# Patient Record
Sex: Female | Born: 1970 | Race: Black or African American | Hispanic: No | Marital: Married | Smoking: Never smoker
Health system: Southern US, Community
[De-identification: ages and names within clinical notes are randomized; demographics above are authoritative.]

## PROBLEM LIST (undated history)

## (undated) DIAGNOSIS — F329 Major depressive disorder, single episode, unspecified: Secondary | ICD-10-CM

## (undated) DIAGNOSIS — F319 Bipolar disorder, unspecified: Secondary | ICD-10-CM

## (undated) DIAGNOSIS — K5190 Ulcerative colitis, unspecified, without complications: Secondary | ICD-10-CM

## (undated) DIAGNOSIS — G894 Chronic pain syndrome: Secondary | ICD-10-CM

## (undated) DIAGNOSIS — M810 Age-related osteoporosis without current pathological fracture: Secondary | ICD-10-CM

## (undated) DIAGNOSIS — G43909 Migraine, unspecified, not intractable, without status migrainosus: Secondary | ICD-10-CM

## (undated) DIAGNOSIS — K5090 Crohn's disease, unspecified, without complications: Secondary | ICD-10-CM

## (undated) DIAGNOSIS — I10 Essential (primary) hypertension: Secondary | ICD-10-CM

## (undated) DIAGNOSIS — G4700 Insomnia, unspecified: Secondary | ICD-10-CM

## (undated) DIAGNOSIS — I639 Cerebral infarction, unspecified: Secondary | ICD-10-CM

## (undated) DIAGNOSIS — K635 Polyp of colon: Secondary | ICD-10-CM

## (undated) DIAGNOSIS — A048 Other specified bacterial intestinal infections: Secondary | ICD-10-CM

## (undated) DIAGNOSIS — E876 Hypokalemia: Secondary | ICD-10-CM

## (undated) DIAGNOSIS — C569 Malignant neoplasm of unspecified ovary: Secondary | ICD-10-CM

## (undated) DIAGNOSIS — K2210 Ulcer of esophagus without bleeding: Secondary | ICD-10-CM

## (undated) DIAGNOSIS — K589 Irritable bowel syndrome without diarrhea: Secondary | ICD-10-CM

## (undated) DIAGNOSIS — F4310 Post-traumatic stress disorder, unspecified: Secondary | ICD-10-CM

## (undated) DIAGNOSIS — F32A Depression, unspecified: Secondary | ICD-10-CM

## (undated) DIAGNOSIS — Z9889 Other specified postprocedural states: Secondary | ICD-10-CM

## (undated) DIAGNOSIS — M797 Fibromyalgia: Secondary | ICD-10-CM

## (undated) DIAGNOSIS — D563 Thalassemia minor: Secondary | ICD-10-CM

## (undated) DIAGNOSIS — M069 Rheumatoid arthritis, unspecified: Secondary | ICD-10-CM

## (undated) DIAGNOSIS — G459 Transient cerebral ischemic attack, unspecified: Secondary | ICD-10-CM

## (undated) HISTORY — DX: Ulcer of esophagus without bleeding: K22.10

## (undated) HISTORY — DX: Polyp of colon: K63.5

## (undated) HISTORY — DX: Other specified postprocedural states: Z98.890

## (undated) HISTORY — DX: Essential (primary) hypertension: I10

## (undated) HISTORY — DX: Hypokalemia: E87.6

## (undated) HISTORY — DX: Irritable bowel syndrome without diarrhea: K58.9

## (undated) HISTORY — DX: Age-related osteoporosis without current pathological fracture: M81.0

## (undated) HISTORY — DX: Migraine, unspecified, not intractable, without status migrainosus: G43.909

## (undated) HISTORY — DX: Bipolar disorder, unspecified: F31.9

## (undated) HISTORY — DX: Insomnia, unspecified: G47.00

## (undated) HISTORY — DX: Cerebral infarction, unspecified: I63.9

## (undated) HISTORY — DX: Depression, unspecified: F32.A

## (undated) HISTORY — DX: Ulcerative colitis, unspecified, without complications: K51.90

## (undated) HISTORY — DX: Other specified bacterial intestinal infections: A04.8

## (undated) HISTORY — DX: Rheumatoid arthritis, unspecified: M06.9

## (undated) HISTORY — DX: Thalassemia minor: D56.3

## (undated) HISTORY — DX: Malignant neoplasm of unspecified ovary: C56.9

## (undated) HISTORY — DX: Chronic pain syndrome: G89.4

## (undated) HISTORY — DX: Major depressive disorder, single episode, unspecified: F32.9

## (undated) HISTORY — DX: Crohn's disease, unspecified, without complications: K50.90

## (undated) HISTORY — PX: REPLACEMENT TOTAL KNEE: SUR1224

## (undated) HISTORY — DX: Transient cerebral ischemic attack, unspecified: G45.9

## (undated) HISTORY — DX: Post-traumatic stress disorder, unspecified: F43.10

## (undated) HISTORY — DX: Fibromyalgia: M79.7

---

## 1995-10-26 HISTORY — PX: APPENDECTOMY: SHX54

## 2000-10-25 HISTORY — PX: ABDOMINAL HYSTERECTOMY: SHX81

## 2007-08-15 ENCOUNTER — Encounter: Payer: Self-pay | Admitting: Endocrinology

## 2007-09-25 ENCOUNTER — Ambulatory Visit: Payer: Self-pay | Admitting: Endocrinology

## 2007-09-25 DIAGNOSIS — F411 Generalized anxiety disorder: Secondary | ICD-10-CM | POA: Insufficient documentation

## 2007-09-25 DIAGNOSIS — R51 Headache: Secondary | ICD-10-CM | POA: Insufficient documentation

## 2007-09-25 DIAGNOSIS — R519 Headache, unspecified: Secondary | ICD-10-CM | POA: Insufficient documentation

## 2007-09-25 DIAGNOSIS — R635 Abnormal weight gain: Secondary | ICD-10-CM | POA: Insufficient documentation

## 2007-09-25 DIAGNOSIS — F329 Major depressive disorder, single episode, unspecified: Secondary | ICD-10-CM | POA: Insufficient documentation

## 2011-01-19 DIAGNOSIS — Z9889 Other specified postprocedural states: Secondary | ICD-10-CM

## 2011-01-19 HISTORY — DX: Other specified postprocedural states: Z98.890

## 2011-02-05 ENCOUNTER — Encounter: Payer: Self-pay | Admitting: Urgent Care

## 2011-02-05 ENCOUNTER — Ambulatory Visit (INDEPENDENT_AMBULATORY_CARE_PROVIDER_SITE_OTHER): Payer: 59 | Admitting: Urgent Care

## 2011-02-05 DIAGNOSIS — R634 Abnormal weight loss: Secondary | ICD-10-CM | POA: Insufficient documentation

## 2011-02-05 DIAGNOSIS — K529 Noninfective gastroenteritis and colitis, unspecified: Secondary | ICD-10-CM | POA: Insufficient documentation

## 2011-02-05 DIAGNOSIS — K219 Gastro-esophageal reflux disease without esophagitis: Secondary | ICD-10-CM

## 2011-02-05 DIAGNOSIS — R1110 Vomiting, unspecified: Secondary | ICD-10-CM

## 2011-02-05 DIAGNOSIS — R197 Diarrhea, unspecified: Secondary | ICD-10-CM

## 2011-02-05 MED ORDER — DEXLANSOPRAZOLE 60 MG PO CPDR: 60.0000 mg | DELAYED_RELEASE_CAPSULE | Freq: Every day | ORAL | Status: AC

## 2011-02-05 MED ORDER — HYOSCYAMINE SULFATE 0.125 MG PO TABS: 0.1250 mg | ORAL_TABLET | Freq: Four times a day (QID) | ORAL | Status: DC | PRN

## 2011-02-05 NOTE — Assessment & Plan Note (Signed)
Unintentional wt loss in the setting of multiple GI concerns including N/V/D.  Will need EGD & colonoscopy for further evaluation.

## 2011-02-05 NOTE — Progress Notes (Signed)
Primary Care Physician:  Emelda Fear, DO Primary Gastroenterologist:  Dr. Gala Romney  Chief Complaint  Patient presents with  . Weight Loss  . Diarrhea  . Emesis    HPI:  Dana Leonard is a 40 y.o. female here as a new patient for evaluation of multiple GI concerns.  She was recently evaluated in [place] for wt loss, nausea, vomiting, & diarrhea & is concerned that something may have been missed.  She wants a second opinion & management here.   Weight loss of 40# in the past 4 months.  "Can't keep any food down."  C/o N/V post-prandially after every meal.  Hx heartburn, indigestion.  Denies dysphagia or odynophagia.  Diarrhea 10-12 stools per day.  Noticed large amt of bright red blood w/ wiping & in commode. X 2 weeks  Avoiding greasy/spicy foods/meats/fruits.  "Stays weak & dehydrated."  Was told recently K+ low, body aches.  Lost mother last yr & father had MI around the time her symptoms started, has had a lot of stress.  Hx depression & fibromyalgia.  Eats 5 meals per day.  Had colonoscopy which showed hemorrhoids in [place] per her report.  Had recent UTI & on abx for this, but after diarrhea started.  Denies any NSAIDs.  No new meds except prilosec & fiber.  Low grade fever intermittently.  Was admitted to [facility] 1 month ago w/ dehydration (will request records).    Past Medical History  Diagnosis Date  . S/P colonoscopy 01/19/2011    Dr. Earley Brooke [facility])  . Hemorrhoids   . Fibromyalgia   . Depression   . HTN (hypertension)   . Migraines   . CVA (cerebral infarction)     2011/2006 Dr.  Bland Span ([place])  . Thalassemia trait   . H. pylori infection     pt believes she never completed treatment  . IBS (irritable bowel syndrome)     pt questioning this diagnosis    Past Surgical History  Procedure Date  . Abdominal hysterectomy 2002  . Appendectomy 1997    Current Outpatient Prescriptions  Medication Sig Dispense Refill  . ALPRAZolam (XANAX)  1 MG tablet Take 1 mg by mouth at bedtime as needed.        . DULoxetine (CYMBALTA) 30 MG capsule Take 30 mg by mouth daily.        . ondansetron (ZOFRAN-ODT) 4 MG disintegrating tablet Take 4 mg by mouth every 8 (eight) hours as needed.        Marland Kitchen QUEtiapine (SEROQUEL) 300 MG tablet Take 300 mg by mouth at bedtime.        Marland Kitchen zolpidem (AMBIEN CR) 12.5 MG CR tablet Take 25 mg by mouth at bedtime as needed.        Marland Kitchen dexlansoprazole (DEXILANT) 60 MG capsule Take 1 capsule (60 mg total) by mouth daily.  30 capsule  0  . hyoscyamine (LEVSIN) 0.125 MG tablet Take 1 tablet (0.125 mg total) by mouth 4 (four) times daily as needed for cramping (diarrhea).  30 tablet  0    Allergies as of 02/05/2011 - Review Complete 02/05/2011  Allergen Reaction Noted  . Latex Rash 02/05/2011  . Codeine    . Morphine  09/25/2007  . Sumatriptan      Family History:There is no known family history of colorectal carcinoma , liver disease, or inflammatory bowel disease.   Problem Relation Age of Onset  . Sickle cell anemia Mother   . Diabetes Mother   .  Coronary artery disease Mother   . Diabetes Father   . Coronary artery disease Father   . Seizures Sister   . Coronary artery disease Sister   . Kidney failure Son     History   Social History  . Marital Status: Married    Spouse Name: N/A    Number of Children: 2  . Years of Education: N/A   Occupational History  . disabled     fibromyalgia, depression  .      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 Smoking status: Never Smoker   . Smokeless tobacco: Never Used  . Alcohol Use: No  . Drug Use: No  . Sexually Active: Yes -- Female partner(s)    Birth Control/ Protection: None     spouse   Other Topics Concern  . Not on file   Social History Narrative   Married x 4yrVolunteers as parenting advocate    Review of Systems: Gen: Denies any sweats, c/o anorexia, fatigue, weakness, malaise, weight  loss, and sleep disorder CV: Denies chest pain, angina, palpitations, syncope, orthopnea, PND, peripheral edema, and claudication. Resp: Denies dyspnea at rest, dyspnea with exercise, cough, sputum, wheezing, coughing up blood, and pleurisy. GI: Denies vomiting blood, jaundice, and fecal incontinence.   Denies dysphagia or odynophagia. GU : Denies urinary burning, blood in urine, urinary frequency, urinary hesitancy, nocturnal urination, and urinary incontinence. MS: Chronic body aches w/ hx fibromyalgia. Derm: Denies rash, itching, dry skin, hives, moles, warts, or unhealing ulcers.  Psych: Denies depression, anxiety, memory loss, suicidal ideation, hallucinations, paranoia, and confusion. Heme: Denies bruising, bleeding, and enlarged lymph nodes.  Physical Exam: BP 106/75  Pulse 75  Temp 97.4 F (36.3 C)  Ht 5' 2"  (1.575 m)  Wt 149 lb (67.586 kg)  BMI 27.25 kg/m2 General:   Alert,  Well-developed, well-nourished, pleasant and cooperative in NAD Head:  Normocephalic and atraumatic. Eyes:  Sclera clear, no icterus.   Conjunctiva pink. Ears:  Normal auditory acuity. Nose:  No deformity, discharge,  or lesions. Mouth:  No deformity or lesions, dentition normal. Neck:  Supple; no masses or thyromegaly. Lungs:  Clear throughout to auscultation.   No wheezes, crackles, or rhonchi. No acute distress. Heart:  Regular rate and rhythm; no murmurs, clicks, rubs,  or gallops. Abdomen:  Soft, nontender and nondistended. No masses, hepatosplenomegaly or hernias noted. Normal bowel sounds, without guarding, and without rebound.   Rectal:  Deferred until time of colonoscopy.   Msk:  Symmetrical without gross deformities. Normal posture. Pulses:  Normal pulses noted. Extremities:  Without clubbing or edema. Neurologic:  Alert and  oriented x4;  grossly normal neurologically. Skin:  Intact without significant lesions or rashes. Cervical Nodes:  No significant cervical adenopathy. Psych:  Alert and  cooperative. Normal mood and affect.

## 2011-02-05 NOTE — Assessment & Plan Note (Signed)
Chronic daily diarrhea x 4 months.  Will r/o c diff.  Doubt infectious cause.  Need to r/o inflammatory bowel disease, celiac disease, & microscopic colitis.

## 2011-02-05 NOTE — Assessment & Plan Note (Signed)
See GERD

## 2011-02-05 NOTE — Assessment & Plan Note (Addendum)
Will have further evaluation with EGD given chronic nausea, vomiting & weight loss to r/o PUD, complicated GERD, h pylori gastritis.  EGD & colonoscopy w/ Dr. Gala Romney in the near future.  Procedure will need to be done with deep sedation (propofol) in the OR under the direction of anesthesia services for polypharmacy.  I have discussed risks & benefits which include, but are not limited to, bleeding, infection, perforation & drug reaction.  The patient agrees with this plan & written consent will be obtained.    To ER with severe pain, nausea, vomiting. Begin Dexilant 75m daily Begin Levsin as directed for diarrhea

## 2011-02-05 NOTE — Patient Instructions (Signed)
To ER with severe pain, nausea, vomiting. Begin Dexilant 74m daily Begin Levsin as directed for diarrhea

## 2011-02-05 NOTE — Progress Notes (Signed)
Reviewed by R. Michael Rourk, MD FACP FACG 

## 2011-02-06 LAB — HEPATIC FUNCTION PANEL
Alkaline Phosphatase: 75 U/L (ref 39–117)
Indirect Bilirubin: 0.3 mg/dL (ref 0.0–0.9)
Total Bilirubin: 0.4 mg/dL (ref 0.3–1.2)
Total Protein: 6.6 g/dL (ref 6.0–8.3)

## 2011-02-06 LAB — CBC
MCV: 85.5 fL (ref 78.0–100.0)
Platelets: 323 10*3/uL (ref 150–400)
RBC: 4.01 MIL/uL (ref 3.87–5.11)
WBC: 5.4 10*3/uL (ref 4.0–10.5)

## 2011-02-06 LAB — BASIC METABOLIC PANEL
CO2: 24 mEq/L (ref 19–32)
Chloride: 106 mEq/L (ref 96–112)
Glucose, Bld: 85 mg/dL (ref 70–99)
Potassium: 3.4 mEq/L — ABNORMAL LOW (ref 3.5–5.3)
Sodium: 143 mEq/L (ref 135–145)

## 2011-02-08 NOTE — Progress Notes (Signed)
Labs faxed to Short Stay for Dr. Gala Romney and faxed to PCP

## 2011-02-09 ENCOUNTER — Encounter: Payer: Self-pay | Admitting: Internal Medicine

## 2011-02-09 NOTE — Progress Notes (Signed)
Pt scheduled for procedure 02/23/2011@11 :00am Instructions placed in the mail to pt.

## 2011-02-13 LAB — HELICOBACTER PYLORI  SPECIAL ANTIGEN

## 2011-02-15 ENCOUNTER — Inpatient Hospital Stay (HOSPITAL_COMMUNITY)
Admission: AD | Admit: 2011-02-15 | Discharge: 2011-02-19 | DRG: 641 | Disposition: A | Discharge status: Home / Self Care | Payer: 59 | Source: Ambulatory Visit | Attending: Otolaryngology | Admitting: Otolaryngology

## 2011-02-15 ENCOUNTER — Other Ambulatory Visit: Payer: Self-pay | Admitting: Internal Medicine

## 2011-02-15 ENCOUNTER — Encounter: Payer: Self-pay | Admitting: Gastroenterology

## 2011-02-15 ENCOUNTER — Telehealth: Payer: Self-pay | Admitting: Urgent Care

## 2011-02-15 ENCOUNTER — Encounter (HOSPITAL_COMMUNITY): Payer: 59

## 2011-02-15 DIAGNOSIS — Z79899 Other long term (current) drug therapy: Secondary | ICD-10-CM

## 2011-02-15 DIAGNOSIS — R55 Syncope and collapse: Secondary | ICD-10-CM | POA: Diagnosis present

## 2011-02-15 DIAGNOSIS — R109 Unspecified abdominal pain: Secondary | ICD-10-CM | POA: Diagnosis present

## 2011-02-15 DIAGNOSIS — IMO0001 Reserved for inherently not codable concepts without codable children: Secondary | ICD-10-CM | POA: Diagnosis present

## 2011-02-15 DIAGNOSIS — Z01812 Encounter for preprocedural laboratory examination: Secondary | ICD-10-CM

## 2011-02-15 DIAGNOSIS — E876 Hypokalemia: Principal | ICD-10-CM | POA: Diagnosis present

## 2011-02-15 DIAGNOSIS — R112 Nausea with vomiting, unspecified: Secondary | ICD-10-CM | POA: Diagnosis present

## 2011-02-15 DIAGNOSIS — F319 Bipolar disorder, unspecified: Secondary | ICD-10-CM | POA: Diagnosis present

## 2011-02-15 DIAGNOSIS — K2100 Gastro-esophageal reflux disease with esophagitis, without bleeding: Secondary | ICD-10-CM | POA: Diagnosis present

## 2011-02-15 DIAGNOSIS — R197 Diarrhea, unspecified: Secondary | ICD-10-CM | POA: Diagnosis present

## 2011-02-15 DIAGNOSIS — G43909 Migraine, unspecified, not intractable, without status migrainosus: Secondary | ICD-10-CM | POA: Diagnosis present

## 2011-02-15 DIAGNOSIS — E860 Dehydration: Secondary | ICD-10-CM | POA: Diagnosis present

## 2011-02-15 DIAGNOSIS — G894 Chronic pain syndrome: Secondary | ICD-10-CM | POA: Diagnosis present

## 2011-02-15 DIAGNOSIS — D649 Anemia, unspecified: Secondary | ICD-10-CM | POA: Diagnosis present

## 2011-02-15 DIAGNOSIS — T426X5A Adverse effect of other antiepileptic and sedative-hypnotic drugs, initial encounter: Secondary | ICD-10-CM | POA: Diagnosis present

## 2011-02-15 LAB — BASIC METABOLIC PANEL
BUN: 14 mg/dL (ref 6–23)
Calcium: 9.2 mg/dL (ref 8.4–10.5)
Creatinine, Ser: 0.85 mg/dL (ref 0.4–1.2)
GFR calc Af Amer: >60 mL/min (ref >60)
GFR calc non Af Amer: >60 mL/min (ref >60)

## 2011-02-15 LAB — COMPREHENSIVE METABOLIC PANEL
Alkaline Phosphatase: 80 U/L (ref 39–117)
BUN: 10 mg/dL (ref 6–23)
CO2: 31 mEq/L (ref 19–32)
Chloride: 100 mEq/L (ref 96–112)
Creatinine, Ser: 0.7 mg/dL (ref 0.4–1.2)
GFR calc non Af Amer: >60 mL/min (ref >60)
Potassium: 2.5 mEq/L — CL (ref 3.5–5.1)
Total Bilirubin: 0.4 mg/dL (ref 0.3–1.2)

## 2011-02-15 LAB — DIFFERENTIAL
Basophils Relative: 1 % (ref 0–1)
Eosinophils Absolute: 0.1 10*3/uL (ref 0.0–0.7)
Eosinophils Relative: 1 % (ref 0–5)
Lymphs Abs: 3.2 10*3/uL (ref 0.7–4.0)
Monocytes Relative: 4 % (ref 3–12)
Neutrophils Relative %: 52 % (ref 43–77)

## 2011-02-15 LAB — CBC
MCH: 29.6 pg (ref 26.0–34.0)
MCHC: 35 g/dL (ref 30.0–36.0)
MCV: 84.4 fL (ref 78.0–100.0)
Platelets: 307 10*3/uL (ref 150–400)
RDW: 12.9 % (ref 11.5–15.5)

## 2011-02-15 NOTE — Telephone Encounter (Signed)
Spoke w/ pt "cannot hold down anything" Diarrhea profuse 10+ stools/day C/o profound weakness & fatigue Pt advised to go to ER She agrees w/ plan

## 2011-02-15 NOTE — Telephone Encounter (Signed)
Pre-op noted K 2.5, TCS planned in 3 days Added MAG to labs already drawn Attempted to call pt--Not available

## 2011-02-15 NOTE — Telephone Encounter (Signed)
Discussed w/ SLF Aware pt sent to ER for hypokalemia, chronic diarrhea, TCS already sched w/ RMR 4/26

## 2011-02-16 ENCOUNTER — Inpatient Hospital Stay (HOSPITAL_COMMUNITY): Payer: 59

## 2011-02-16 ENCOUNTER — Emergency Department (HOSPITAL_COMMUNITY): Payer: 59

## 2011-02-16 DIAGNOSIS — R112 Nausea with vomiting, unspecified: Secondary | ICD-10-CM

## 2011-02-16 DIAGNOSIS — R197 Diarrhea, unspecified: Secondary | ICD-10-CM

## 2011-02-16 DIAGNOSIS — E876 Hypokalemia: Secondary | ICD-10-CM

## 2011-02-16 DIAGNOSIS — R55 Syncope and collapse: Secondary | ICD-10-CM

## 2011-02-16 LAB — CARDIAC PANEL(CRET KIN+CKTOT+MB+TROPI)
Relative Index: INVALID (ref 0.0–2.5)
Total CK: 28 U/L (ref 7–177)
Troponin I: 0.02 ng/mL (ref 0.00–0.06)

## 2011-02-16 LAB — DIFFERENTIAL
Basophils Absolute: 0 10*3/uL (ref 0.0–0.1)
Basophils Relative: 0 % (ref 0–1)
Eosinophils Absolute: 0.1 10*3/uL (ref 0.0–0.7)
Monocytes Relative: 4 % (ref 3–12)
Neutro Abs: 2.7 10*3/uL (ref 1.7–7.7)
Neutrophils Relative %: 44 % (ref 43–77)

## 2011-02-16 LAB — COMPREHENSIVE METABOLIC PANEL
ALT: 13 U/L (ref 0–35)
AST: 14 U/L (ref 0–37)
Albumin: 3.1 g/dL — ABNORMAL LOW (ref 3.5–5.2)
Calcium: 8.7 mg/dL (ref 8.4–10.5)
Chloride: 107 mEq/L (ref 96–112)
Creatinine, Ser: 0.66 mg/dL (ref 0.4–1.2)
GFR calc Af Amer: >60 mL/min (ref >60)
Sodium: 140 mEq/L (ref 135–145)

## 2011-02-16 LAB — RAPID URINE DRUG SCREEN, HOSP PERFORMED
Amphetamines: NOT DETECTED
Barbiturates: NOT DETECTED
Opiates: NOT DETECTED

## 2011-02-16 LAB — IRON AND TIBC
Saturation Ratios: 25 % (ref 20–55)
TIBC: 248 ug/dL — ABNORMAL LOW (ref 250–470)
UIBC: 185 ug/dL

## 2011-02-16 LAB — CK TOTAL AND CKMB (NOT AT ARMC)
CK, MB: 0.6 ng/mL (ref 0.3–4.0)
Relative Index: INVALID (ref 0.0–2.5)
Total CK: 32 U/L (ref 7–177)

## 2011-02-16 LAB — CBC
MCH: 29.6 pg (ref 26.0–34.0)
MCHC: 35.1 g/dL (ref 30.0–36.0)
Platelets: 262 10*3/uL (ref 150–400)
RBC: 3.35 MIL/uL — ABNORMAL LOW (ref 3.87–5.11)

## 2011-02-16 LAB — TROPONIN I: Troponin I: 0.02 ng/mL (ref 0.00–0.06)

## 2011-02-16 LAB — TSH: TSH: 1.886 u[IU]/mL (ref 0.350–4.500)

## 2011-02-16 LAB — HEMOCCULT GUIAC POC 1CARD (OFFICE): Fecal Occult Bld: POSITIVE

## 2011-02-16 LAB — FERRITIN: Ferritin: 82 ng/mL (ref 10–291)

## 2011-02-17 LAB — BASIC METABOLIC PANEL
CO2: 24 mEq/L (ref 19–32)
Calcium: 9.4 mg/dL (ref 8.4–10.5)
Chloride: 107 mEq/L (ref 96–112)
GFR calc Af Amer: >60 mL/min (ref >60)
Glucose, Bld: 97 mg/dL (ref 70–99)
Potassium: 3.4 mEq/L — ABNORMAL LOW (ref 3.5–5.1)
Sodium: 140 mEq/L (ref 135–145)

## 2011-02-17 LAB — CBC
HCT: 31.2 % — ABNORMAL LOW (ref 36.0–46.0)
Hemoglobin: 10.7 g/dL — ABNORMAL LOW (ref 12.0–15.0)
MCHC: 34.3 g/dL (ref 30.0–36.0)
RBC: 3.65 MIL/uL — ABNORMAL LOW (ref 3.87–5.11)

## 2011-02-17 LAB — OVA AND PARASITE EXAMINATION

## 2011-02-17 LAB — DIFFERENTIAL
Eosinophils Absolute: 0.2 10*3/uL (ref 0.0–0.7)
Eosinophils Relative: 2 % (ref 0–5)
Lymphs Abs: 4.4 10*3/uL — ABNORMAL HIGH (ref 0.7–4.0)
Monocytes Absolute: 0.2 10*3/uL (ref 0.1–1.0)
Monocytes Relative: 3 % (ref 3–12)

## 2011-02-18 ENCOUNTER — Other Ambulatory Visit: Payer: Self-pay | Admitting: Internal Medicine

## 2011-02-18 ENCOUNTER — Encounter: Payer: PRIVATE HEALTH INSURANCE | Admitting: Internal Medicine

## 2011-02-18 ENCOUNTER — Encounter: Payer: 59 | Admitting: Internal Medicine

## 2011-02-18 ENCOUNTER — Encounter: Payer: Self-pay | Admitting: Internal Medicine

## 2011-02-18 DIAGNOSIS — K589 Irritable bowel syndrome without diarrhea: Secondary | ICD-10-CM

## 2011-02-18 DIAGNOSIS — R112 Nausea with vomiting, unspecified: Secondary | ICD-10-CM

## 2011-02-18 DIAGNOSIS — R109 Unspecified abdominal pain: Secondary | ICD-10-CM

## 2011-02-18 DIAGNOSIS — R197 Diarrhea, unspecified: Secondary | ICD-10-CM

## 2011-02-18 DIAGNOSIS — K2100 Gastro-esophageal reflux disease with esophagitis, without bleeding: Secondary | ICD-10-CM

## 2011-02-18 DIAGNOSIS — K2210 Ulcer of esophagus without bleeding: Secondary | ICD-10-CM

## 2011-02-18 DIAGNOSIS — K5289 Other specified noninfective gastroenteritis and colitis: Secondary | ICD-10-CM

## 2011-02-18 HISTORY — DX: Ulcer of esophagus without bleeding: K22.10

## 2011-02-18 HISTORY — DX: Irritable bowel syndrome, unspecified: K58.9

## 2011-02-18 LAB — BASIC METABOLIC PANEL
BUN: 6 mg/dL (ref 6–23)
CO2: 22 mEq/L (ref 19–32)
Calcium: 9.3 mg/dL (ref 8.4–10.5)
Chloride: 106 mEq/L (ref 96–112)
Creatinine, Ser: 0.63 mg/dL (ref 0.4–1.2)
Glucose, Bld: 117 mg/dL — ABNORMAL HIGH (ref 70–99)

## 2011-02-18 LAB — CLOSTRIDIUM DIFFICILE BY PCR: Toxigenic C. Difficile by PCR: NEGATIVE

## 2011-02-19 LAB — OVA AND PARASITE EXAMINATION: Ova and parasites: NONE SEEN

## 2011-02-19 LAB — FECAL LACTOFERRIN, QUANT: Fecal Lactoferrin: NEGATIVE

## 2011-02-20 LAB — STOOL CULTURE

## 2011-02-22 LAB — STOOL CULTURE

## 2011-02-23 ENCOUNTER — Encounter: Payer: PRIVATE HEALTH INSURANCE | Admitting: Internal Medicine

## 2011-02-23 NOTE — Op Note (Signed)
Dana Leonard, Dana Leonard               ACCOUNT NO.:  [id number]  MEDICAL RECORD NO.:  [id number]           PATIENT TYPE:  I  LOCATION:  H607                          FACILITY:  [facility]  PHYSICIAN:  R. Garfield Cornea, M.D. DATE OF BIRTH:  28-Mar-1971  DATE OF PROCEDURE:  02/18/2011 DATE OF DISCHARGE:                              OPERATIVE REPORT   Esophagogastroduodenoscopy with small bowel biopsy, followup ileal colonoscopy, biopsy stool sampling.  INDICATIONS FOR PROCEDURE:  A 50-year lady with chronic abdominal pain, nausea, vomiting, weight loss, nonbloody diarrhea, ileal colonoscopy, and EGD now being done.  Risks, benefits, limitations, alternatives, imponderables have been discussed.  Because of her psychiatric issues, polypharmacy is being done under deep sedation with propofol with the help of Dr. Patsey Berthold and associates.  Risks, benefits, alternatives, limitations, imponderables have been discussed, questions answered. Please see the documentation and medical record.  PROCEDURE NOTE:  O2 saturation, blood pressure, pulse, respirations were monitored throughout the entire procedure.  Cetacaine spray for topical pharyngeal anesthesia, propofol per Dr. Patsey Berthold and associates.  INSTRUMENT:  Pentax video chip system.  FINDINGS:  EGD:  Examination of the tubular esophagus revealed 2-3 mm distal linear erosions at the GE junction.  Esophageal mucosa otherwise appeared normal.  Stomach:  Gas cavity was not empty.  There was quite a bit of retained material in the stomach, query wax matrix material versus vegetable matter versus possibly candy of some sort which precluded complete examination of the stomach.  I was able to see a good bit of stomach and assessed the antrum and pylorus.  Mucosa otherwise appeared normal.  Retroflexed to proximal stomach, esophagogastric junction demonstrated a small hiatal hernia.  Pylorus patent, easily traversed.  Examination of bulb, second and  third portion demonstrated abnormal small bowel mucosa with multiple areas of focal loss tissue with erosions and erythema of the mucosa.  This appeared to be abnormal mucosa.  THERAPEUTIC/DIAGNOSTIC MANEUVERS PERFORMED:  Biopsies of D2 and D3 were taken for histologic study, and also sample of the material in the stomach was submitted for pathologist.  The patient tolerated the procedure well and was prepared for colonoscopy.  Digital rectal exam revealed no abnormalities.  FINDINGS:  On endoscopic finding, prep was suboptimal with vegetable material and some granular stool throughout the colon which made the exam more challenging.  Colon:  Colonic mucosa was surveyed from the rectosigmoid junction through the left transverse to right colon to the appendiceal orifice, ileocecal valve/cecum.  Structures were seen photographed for the record.  Terminal ileum was intubated to 5 cm. From this level, the scope was slowly withdrawn and all previous mentioned mucosal surfaces were again seen.  The colonic mucosa appeared grossly normal as did terminal ileum mucosa.  Stool sample submitted to for Microbiology Lab, also biopsies of the ascending and descending segment taken for histologic study.  Scope was pulled down the rectum. A thorough examination of rectal mucosa including retroflex view at the anal verge demonstrated no abnormalities.  The patient tolerated this procedure well.  Cecal withdrawal time, 8 minutes.  IMPRESSION: 1. Esophagogastroduodenoscopy, tiny distal esophageal erosion     consistent  with mild erosive reflux esophagitis. 2. Retained gastric contents of uncertain significance in origin,     status post sampling, small hiatal hernia, patent pylorus, abnormal-     appearing small bowel of uncertain significance, status post     biopsy.  Colonoscopy findings, normal rectum. 3. Marginal poor prep, grossly normal colonic mucosa and terminal     ileum mucosa, status post  biopsy and stool sampling.  RECOMMENDATIONS: 1. Low residue diet. 2. Follow up on pending studies. 3. Continue proton pump inhibitor therapy as you were doing.     Bridgette Habermann, M.D.     RMR/MEDQ  D:  02/18/2011  T:  02/18/2011  Job:  [id number]  cc:   Dr. Sonia Baller, [place] Team  Electronically Signed by Jannette Spanner M.D. on 02/23/2011 07:52:17 PM

## 2011-02-25 ENCOUNTER — Encounter: Payer: Self-pay | Admitting: Internal Medicine

## 2011-03-01 ENCOUNTER — Ambulatory Visit (HOSPITAL_COMMUNITY)
Admission: RE | Admit: 2011-03-01 | Discharge: 2011-03-01 | Disposition: A | Discharge status: Home / Self Care | Payer: 59 | Source: Ambulatory Visit | Attending: Internal Medicine | Admitting: Internal Medicine

## 2011-03-01 NOTE — Discharge Summary (Signed)
Dana Leonard, Dana Leonard               ACCOUNT NO.:  [id number]  MEDICAL RECORD NO.:  [id number]           PATIENT TYPE:  I  LOCATION:  A206                          FACILITY:  [facility]  PHYSICIAN:  Kamyra Schroeck L. Conley Leonard, MDDATE OF BIRTH:  [birth year]  DATE OF ADMISSION:  02/15/2011 DATE OF DISCHARGE:  04/27/2012LH                              DISCHARGE SUMMARY   DISCHARGE DIAGNOSES: 1. Hypokalemia, resolved. 2. Syncope, suspect secondary to polypharmacy. 3. Chronic abdominal pain and diarrhea, improved. 4. Chronic migraines. 5. Fibromyalgia. 6. Bipolar disorder. 7. History of peripheral edema. 8. Polypharmacy.  DISCHARGE MEDICATIONS: 1. Xanax 1 mg p.o. q.i.d. 2. Seroquel XR 300 mg 2 tablets nightly. 3. Stop chlorthalidone. 4. Topamax 50 mg p.o. b.i.d. 5. Zanaflex 4 mg p.o. b.i.d. 6. Nucynta 100 mg p.o. q.i.d. 7. Omeprazole changed to 40 mg a day. 8. Change the Ambien to 10 mg nightly instead of 20. 9. Continue furosemide 40 mg as needed for swelling. 10.Depakote 500 mg p.o. b.i.d. 11.Cymbalta 30 mg p.o. nightly. 12.Migranal nasal spray as needed daily for migraines. 13.Stop cyclobenzaprine. 14.Continue over-the-counter vitamin B12 and thiamine as previous.  CONDITION:  Stable.  ACTIVITY:  No driving for 2 weeks.  FOLLOWUP:  With Dr. Gala Romney at which time her Pathology report will be available, currently pending.  Follow up with her primary care physician to monitor electrolytes.  CONDITION:  Stable.  DIET:  Should be low residue.  CONSULTATIONS:  Bridgette Habermann, MD  PROCEDURES:  EGD which showed retained gastric contents, small hiatal hernia, tiny distal esophageal erosion consistent with mild erosive reflux esophagitis, erosions of the small bowel of uncertain significant.  LABORATORY DATA ON ADMISSION:  Potassium was 2.5, at discharge potassium is 4.  CBC is significant for a hemoglobin of 11.2, hematocrit 32.0. Normal MCV, normal liver function tests and normal  magnesium.  Cardiac enzymes normal.  Anemia panel unremarkable.  TSH and free T4 normal. Urine drug screen negative.  Hemoccult of the stool positive.  C. diff of the stool negative.  Fecal lactoferrin normal.  Stool cultures normal.  Thus far, ova and parasite negative.  DIAGNOSTICS:  The patient refused EEG.  CT brain negative.  Abdominal films negative.  Echocardiogram normal.  EKG showed normal sinus rhythm with nonspecific changes.  HISTORY AND HOSPITAL COURSE:  Please see H and P for details.  Dana Leonard is a unassigned black female with chronic abdominal pain, diarrhea and polypharmacy.  She was getting preprocedure labs with Dr. Gala Romney when her potassium as an outpatient was noted to be about two and half.  She apparently also had a syncopal episode.  She was admitted to the hospitalist service and her potassium was repleted.  GI was consulted and performed EGD and colonoscopy, biopsies are pending.  The patient has chronic pain including migraines and fibromyalgia.  She had some chest pain while here.  She is on multiple sedating medications and Dr. Caryn Section felt that her syncopal episode may have been related to this. At the time of discharge, she has had no diarrhea.  She is tolerating 100% of her meals.  Her electrolyte disturbances are corrected and  she is stable for discharge.  She will follow up with Dr. Gala Romney for her biopsy results.  She also need to follow up with her primary care physician for the electrolyte abnormalities.  Her abdominal pain and diarrhea has been chronic.  She was on 20 mg of omeprazole prior to admission which we have increased to 40.  I have changed her Ambien dose and stopped her cyclobenzaprine as well as the chlorthalidone.  We tried to titrate down some of her psychotropic medications which apparently she had problems with.  Hopefully, this can be done with assistance of her primary care physician and pain management specialist and psychiatrist  as she is on multiple sedating medications and one of her complaints is fatigue.  Total time on the day of discharge is greater than 30 minutes.     Dana Blanks L. Conley Canal, MD     CLS/MEDQ  D:  02/19/2011  T:  02/20/2011  Job:  [id number]  Electronically Signed by Doree Barthel MD on 03/01/2011 08:06:00 AM

## 2011-03-07 NOTE — H&P (Signed)
NAMEVICI, NOVICK               ACCOUNT NO.:  [id number]  MEDICAL RECORD NO.:  [id number]           PATIENT TYPE:  E  LOCATION:  APED                          FACILITY:  [facility]  PHYSICIAN:  Rise Patience, MDDATE OF BIRTH:  05-Sep-1971  DATE OF ADMISSION:  02/15/2011 DATE OF DISCHARGE:  LH                             HISTORY & PHYSICAL   PRIMARY CARE PHYSICIAN:  Unassigned.  GASTROENTEROLOGIST:  Bridgette Habermann, MD  CHIEF COMPLAINT:  Low potassium and loss of consciousness.  HISTORY OF PRESENT ILLNESS:  This is a 40 year old female with history of bipolar disorder, fibromyalgia, migraine headaches, and previous history of CVA.  She has been experiencing nausea, vomiting, and abdominal pain with diarrhea over the last 3-4 months.  The patient has initially gone to a doctor at [facility] and had endoscopy and colonoscopy done in January and results of which are not known to the patient.  She had gone from Gastroenterology, Dr. Gala Romney who is scheduling endoscopy and colonoscopy next week for further workup on her intractable nausea, vomiting, and diarrhea with abdominal pain.  Had done an initial labs today and was found to have potassium of 2.5.  The patient was instructed to come to the hospital.  The patient over last 2 days said she got more weaker than usual and approximately lost consciousness in the hospital and also in the home.  It lasted about few minutes and did not have any loss of function of upper and lower extremities.  Did not have any difficulty speaking or any visual symptoms.  In the ER, the patient was found to have a potassium of 2.5.  At this time, the patient has already got 80 mEq of p.o. potassium along with 2 K runs.  The patient has been admitted for dehydration and hypokalemia.  The patient does take chlorthalidone and the patient also has been having recurrent diarrhea which could be contributing to her hypokalemia.  The patient states at time she  is getting off and on chest pain, retrosternal, pressure-like, moderate with exertion.  It lasts for few minutes and also associated with some shortness of breath.  These things have been happening over the last 2-3 days.  Denies any fever, chills, cough, or phlegm.  Denies any focal deficit or headache at this time.  PAST MEDICAL HISTORY: 1. Bipolar disorder, fibromyalgia, previous history of CVA 10 years     ago.  At this time, she had dysarthria, memory problems, and also     hemiplegia on the right side, it is completely resolved.  The     patient states that she still has some residual memory problems. 2. Previous history of migraine headaches.  PAST SURGICAL HISTORY:  Hysterectomy for cancer.  She does not know exactly what type of cancer and also history of appendectomy.  MEDICATIONS PRIOR TO ADMISSION: 1. Xanax 1 mg p.o. 4 times daily. 2. Seroquel 600 mg at bedtime. 3. Ambien 20 mg at bedtime. 4. Chlorthalidone 25 mg daily. 5. Topamax 50 mg twice daily in the bottles, but the patient states     she takes 150 daily.  We  need to verify this. 6. Zanaflex 4 mg twice daily. 7. Nucynta 100 mg 4 times daily. 8. Prilosec 20 mg daily.  ALLERGIES: 1. CODEINE. 2. LATEX. 3. LORTAB. 4. LYRICA. 5. METHADONE. 6. MORPHINE. 7. PERCOCET.  FAMILY HISTORY:  Positive for hypertension and diabetes.  SOCIAL HISTORY:  The patient does not smoke cigarette, drink alcohol, or use illegal drugs.  Married, lives with her husband.  REVIEW OF SYSTEMS:  As per the history of present illness.  In addition, the patient had recently taken Bactrim last week for UTI and she did take a course a month before also.  Presently, the patient does not have any urinary symptoms.  PHYSICAL EXAMINATION:  GENERAL:  The patient is examined at bedside, not in acute distress. VITAL SIGNS:  Blood pressure 104/70, pulse 90 per minute, temperature 97.6, respirations 20 per minutes, and O2 sat 97%. HEENT:   Anicteric.  No pallor.  No discharge from ears, eyes, nose, or mouth.  No facial asymmetry.  Tongue is midline. NECK:  No neck rigidity. CHEST:  Bilateral air entry present.  No rhonchi.  No crepitation. HEART:  S1 and S2 heard. ABDOMEN:  Soft.  There is mild tenderness, particularly in the lower quadrants and no guarding or rigidity. CENTRAL NERVOUS SYSTEM:  Alert, awake, and oriented to time, place, and person.  Moves upper and extremities 5/5. EXTREMITIES:  Peripheral pulses felt.  No edema.  LABORATORY DATA:  EKG shows normal sinus rhythm with heart rate around 77 beats per minute with some nonspecific ST-T changes.  I had no old EKG to compare.  CBC:  WBC 7.5, hemoglobin 11.2, hematocrit 32, and platelets 307.  Basic metabolic panel:  Sodium 226, potassium 2.5, chloride 102, carbon dioxide 32, glucose 85, BUN 14, creatinine 0.8, total bilirubin is 0.4, alk phos 80, AST 15, ALT 14, total protein 6.4, albumin 3.8, calcium 9.1, magnesium is 2.1.  ASSESSMENT: 1. Dehydration with history of chronic nausea, vomiting, abdominal     pain, and diarrhea, unclear etiology. 2. Hypokalemia. 3. Chest pain and short of breath. 4. Syncope. 5. History of fibromyalgia. 6. History of bipolar disorder. 7. History of migraine headache. 8. Previous cerebrovascular accident. 9. Anemia, normocytic normochromic.  PLAN: 1. At this time, admit the patient to telemetry. 2. For her hypokalemia, the patient has already received 80 mEq of     p.o. potassium chloride and 2 K runs.  I am going to add potassium     40 mEq along with IV fluids.  We will recheck her potassium in     another 3 hours, not at the same time.  Again, we will check     magnesium. 3. Chronic nausea, vomiting, abdominal pain, and diarrhea.  We will     check stool studies.  At this time, I also have requested Dr. Gala Romney     to see the patient.  At this time, LFTs are normal.  There is no     fever, no WBC count.  I am just ordering  a KUB at this time and     further recommendation based on Dr. Gala Romney. 4. Chest pain, syncope, and shortness of breath.  At this time, I will     check a D-dimer.  I will cycle cardiac markers as the patient's EKG     does show nonspecific ST-T changes.  We will get a CT of head     without contrast.  At this time, the patient is chest pain free.  I     am not placing the patient on any aspirin for now. 5. For her bipolar and fibromyalgia and migraine headache, we will     continue present medication.  We will need to verify her Topamax     dose. 6. Further recommendations based on tests ordered and clinical course     and consult with Dr. Seward Speck.     Rise Patience, MD     ANK/MEDQ  D:  02/16/2011  T:  02/16/2011  Job:  [id number]  cc:   R. Garfield Cornea, M.D. [address] [place] [address]  Electronically Signed by Gean Birchwood MD on 03/07/2011 08:23:37 AM

## 2011-03-08 ENCOUNTER — Ambulatory Visit: Payer: 59 | Admitting: Urgent Care

## 2011-03-08 ENCOUNTER — Encounter: Payer: Self-pay | Admitting: Endocrinology

## 2011-03-09 ENCOUNTER — Encounter: Payer: Self-pay | Admitting: Urgent Care

## 2011-03-09 ENCOUNTER — Ambulatory Visit (HOSPITAL_COMMUNITY): Payer: 59

## 2011-03-09 ENCOUNTER — Other Ambulatory Visit: Payer: Self-pay | Admitting: Urgent Care

## 2011-03-09 ENCOUNTER — Ambulatory Visit (INDEPENDENT_AMBULATORY_CARE_PROVIDER_SITE_OTHER): Payer: 59 | Admitting: Urgent Care

## 2011-03-09 VITALS — BP 103/67 | HR 97 | Temp 97.7°F | Ht 62.0 in | Wt 142.0 lb

## 2011-03-09 DIAGNOSIS — K2210 Ulcer of esophagus without bleeding: Secondary | ICD-10-CM | POA: Insufficient documentation

## 2011-03-09 DIAGNOSIS — K208 Other esophagitis without bleeding: Secondary | ICD-10-CM

## 2011-03-09 DIAGNOSIS — K529 Noninfective gastroenteritis and colitis, unspecified: Secondary | ICD-10-CM

## 2011-03-09 DIAGNOSIS — R197 Diarrhea, unspecified: Secondary | ICD-10-CM

## 2011-03-09 NOTE — Progress Notes (Signed)
Referring Provider: Emelda Fear, DO Primary Care Physician:  Emelda Fear, DO Primary Gastroenterologist:  Dr. Gala Romney  Chief Complaint  Patient presents with  . Diarrhea    incontinent of BM's    HPI:  Dana Leonard is a 40 y.o. female here for follow up for hospitalization for abd pain, nausea, vomiting & chronic diarrhea.  She also had hypokalemia.  Admitted [facility] /23, had ileocolonoscopy & EGD during admission which was normal w/ benign SB & random biopsies.  C/o diarrhea & vomiting.  Incontinence in stools. BM 8-10 stools per day w/ occ small amt bright red blood.  Husband doesn't sleep in bed w/ her due to her incontinence.  3-4 nights/ week.  Eating bland foods seem to help.  Weight loss 7# since seen here 1 mo ago.  Eating 6 mini meals per day.  Aslo noted to have anemia without evidence of iron deficiency.  Stool studies benign.  Past Medical History  Diagnosis Date  . S/P colonoscopy 01/19/2011    Dr. Earley Brooke [facility])  . Hemorrhoids   . Fibromyalgia   . Depression   . HTN (hypertension)   . Migraines   . CVA (cerebral infarction)     2011/2006 Dr.  Bland Span ([place])  . Thalassemia trait   . H. pylori infection   . IBS (irritable bowel syndrome) 02/18/11    normal ileocolonsocpy w/ Dr Gala Romney  w/ benign SB bx  . Esophagitis, erosive 02/18/11    (mild) on EGD Dr Gala Romney  . Hypokalemia     Past Surgical History  Procedure Date  . Abdominal hysterectomy 2002  . Appendectomy 1997    Current Outpatient Prescriptions  Medication Sig Dispense Refill  . ALPRAZolam (XANAX) 1 MG tablet Take 1 mg by mouth at bedtime as needed.        Marland Kitchen dexlansoprazole (DEXILANT) 60 MG capsule Take 60 mg by mouth daily.        . DULoxetine (CYMBALTA) 30 MG capsule Take 30 mg by mouth daily.        . hyoscyamine (LEVSIN, ANASPAZ) 0.125 MG tablet Take 0.125 mg by mouth 4 (four) times daily -  before meals and at bedtime.        . ondansetron (ZOFRAN-ODT) 4 MG disintegrating tablet Take 4 mg  by mouth every 8 (eight) hours as needed.        . potassium chloride SA (K-DUR,KLOR-CON) 20 MEQ tablet 20 mEq Daily.      . QUEtiapine (SEROQUEL) 300 MG tablet Take 300 mg by mouth at bedtime.        Marland Kitchen tiZANidine (ZANAFLEX) 4 MG tablet as needed.      . topiramate (TOPAMAX) 50 MG tablet 50 mg Daily.      Marland Kitchen zolpidem (AMBIEN CR) 12.5 MG CR tablet Take 25 mg by mouth at bedtime as needed.        Marland Kitchen DISCONTD: omeprazole (PRILOSEC) 20 MG capsule         Allergies as of 03/09/2011 - Review Complete 03/09/2011  Allergen Reaction Noted  . Latex Rash 02/05/2011  . Methadone  03/09/2011  . Percocet (oxycodone-acetaminophen)  03/09/2011  . Codeine    . Morphine  09/25/2007  . Sumatriptan      Review of Systems: Gen: c/o anorexia., fatigue, malaise. CV: Denies chest pain, angina, palpitations, syncope, orthopnea, PND, peripheral edema, and claudication. Resp: Denies dyspnea at rest, dyspnea with exercise, cough, sputum, wheezing, coughing up blood, and pleurisy. GI: Denies vomiting blood, jaundice. Denies dysphagia or  odynophagia. Derm: Denies rash, itching, dry skin, hives, moles, warts, or unhealing ulcers.  Psych: Denies depression, anxiety, memory loss, suicidal ideation, hallucinations, paranoia, and confusion. Heme: Denies bruising, bleeding, and enlarged lymph nodes.  Physical Exam: BP 103/67  Pulse 97  Temp 97.7 F (36.5 C)  Ht 5' 2"  (1.575 m)  Wt 142 lb (64.411 kg)  BMI 25.97 kg/m2 General:   Alert,  Well-developed, well-nourished, pleasant and cooperative in NAD Head:  Normocephalic and atraumatic. Eyes:  Sclera clear, no icterus.   Conjunctiva pink. Mouth:  No deformity or lesions, dentition normal. Neck:  Supple; no masses or thyromegaly. Heart:  Regular rate and rhythm; no murmurs, clicks, rubs,  or gallops. Abdomen:  Soft, nontender and nondistended. No masses, hepatosplenomegaly or hernias noted. Normal bowel sounds, without guarding, and without rebound.   Msk:   Symmetrical without gross deformities. Normal posture. Pulses:  Normal pulses noted. Extremities:  Without clubbing or edema. Neurologic:  Alert and  oriented x4;  grossly normal neurologically. Skin:  Intact without significant lesions or rashes. Cervical Nodes:  No significant cervical adenopathy. Psych:  Alert and cooperative. Normal mood and affect.

## 2011-03-09 NOTE — Patient Instructions (Signed)
Use levsin 4 times per day (before each meal & bedtime) Begin ALIGN once daily  Continue Dexilant 10m daily Stop omeprazole I will call CT results are back

## 2011-03-09 NOTE — Assessment & Plan Note (Signed)
Continue Dexilant 28m daily.  Doing well.    Stop omeprazole she was inadvertently taking.

## 2011-03-09 NOTE — Assessment & Plan Note (Addendum)
?   Etiology of chronic diarrhea.  Ileocolonoscopy, SB bx & EGD without cause.  IBS remains in the differential.  Continued weight loss may be contributed to multiple psychoactive medications being adjusted.  CT abd/pelvis w/ IV/oral contrast to r/o lymphoma/occult malignancy.  If normal consider testing for lactose intolerance & SBBO.   Use levsin 4 times per day (before each meal & bedtime) Begin ALIGN once daily  I will call CT results are back

## 2011-03-10 NOTE — Progress Notes (Signed)
Cc to PCP 

## 2011-03-15 ENCOUNTER — Ambulatory Visit (HOSPITAL_COMMUNITY): Payer: 59 | Attending: Urgent Care

## 2011-03-16 ENCOUNTER — Telehealth: Payer: Self-pay | Admitting: Urgent Care

## 2011-03-16 NOTE — Telephone Encounter (Signed)
Please call pt to find out why she no showed CT scan? Is she planning on rescheduling? Thanks

## 2011-03-17 NOTE — Telephone Encounter (Signed)
Called pt- LMOM, tried to call cell # and it has been disconnected

## 2011-03-18 NOTE — Telephone Encounter (Signed)
Let's send a do not neglect your health letter please, Thanks

## 2011-03-23 NOTE — Consult Note (Signed)
NAMEOMOLARA, CAROL               ACCOUNT NO.:  [id number]  MEDICAL RECORD NO.:  [id number]           PATIENT TYPE:  I  LOCATION:  T465                          FACILITY:  [facility]  PHYSICIAN:  Barney Drain, M.D.     DATE OF BIRTH:  [birth year]  DATE OF CONSULTATION: DATE OF DISCHARGE:                                CONSULTATION   PRIMARY GASTROENTEROLOGIST:  Bridgette Habermann, MD  REASON FOR CONSULTATION:  Chronic abdominal pain, nausea, vomiting, and diarrhea.  HISTORY OF PRESENT ILLNESS:  Ms. Gignac is a 40 year old black female who was initially evaluated by myself on February 05, 2011, as an outpatient in the clinic.  She had multiple GI concerns and had actually been evaluated previously in [place] and [place] where she resides.  She has history of weight loss, nausea, vomiting, and diarrhea.  She had a previous colonoscopy and EGD in [place], which showed hemorrhoids per her report.  She tells me she had, had a 40-pound weight loss in the past 4 months.  She also states she cannot keep anything down.  She was scheduled to have a colonoscopy with Dr. Gala Romney on February 18, 2011, in the OR.  She presented to preop yesterday and a metabolic panel ordered.  She was found to have a potassium of 2.5.  She was advised to go to the emergency room because she was complaining of weakness, fatigue, chronic diarrhea, which had not resolved.  While in the emergency room apparently she had a syncopal episode.  This morning, she is lying in bed with her eyes closed and is somewhat difficult to arouse and obtain history.  However, she does respond to some questions upon prompting.  She tells me she has had continuous diarrhea, too numerous to count.  She has a previous history of bright red blood in her stools wiping on the toilet tissue.  She is complaining of fatigue and weakness.  She has history of chronic hypokalemia and is on chlorthalidone as well as multiple psychiatric medications.   When she was seen in the office, she did not disclose all of her psychotropic medications to me.  She is taking Xanax 1 mg q.i.d., Seroquel 600 mg at bedtime, Ambien 20 mg at bedtime, Zanaflex 4 mg b.i.d., Nucynta 100 mg q.i.d.  Her urine drug screen was negative.  She had a one-view abdominal film, which was negative.  She has stool for C. diff culture of O and P pending.  She also has anemia panel pending.  She had a negative C. diff by PCR and stool culture outpatient.  PAST MEDICAL AND SURGICAL HISTORY:  Colonoscopy and EGD by Dr. Earley Brooke in [place], [place] which was normal per pt.  She has history of fibromyalgia and chronic pain, depression, hemorrhoids, hypertension, and migraine headaches.  She had a CVA in 2006 and 2011. She is followed by Dr. Bland Span in [facility].  She has thalassemia trait.  She has history of H. pylori infection, but does not believe that she ever completed treatment for this; however, outpatient clinic she had a negative stool antigen test.  PAST MEDICAL HISTORY:  She has history of IBS, depression.  PAST SURGICAL HISTORY:  Abdominal hysterectomy in 2002, appendectomy in 1997.  MEDICATIONS PRIOR TO ADMISSION: 1. Xanax 1 mg q.i.d. 2. Seroquel 600 mg at bedtime. 3. Ambien 20 mg at bedtime. 4. Chlorthalidone 25 mg b.i.d. 5. Topamax 50 mg b.i.d. 6. Zanaflex 4 mg b.i.d. 7. Nucynta 100 mg q.i.d. 8. Prilosec 20 mg daily.  ALLERGIES:  CODEINE, LATEX, LORTAB, LYRICA, METHADONE, MORPHINE, PERCOCET, and SUMATRIPTAN.  FAMILY HISTORY:  There is no family history of colon carcinoma, liver or chronic GI problems.  Mother did have diabetes mellitus and sickle cell anemia as well as coronary disease.  Father with diabetes mellitus, coronary artery disease.  She has a sister with seizures and a son with renal failure.  SOCIAL HISTORY:  She is married.  She has 2 children.  She is disabled from her fibromyalgia.  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  She has never been a smoker.  She denies any illicit drug use.  She denies any alcohol use.  REVIEW OF SYSTEMS:  See HPI otherwise today she would not respond to my questioning.  PHYSICAL EXAMINATION:  VITAL SIGNS:  Temperature 97.9, pulse 88, respirations 20, blood pressure 95/58, O2 sat 98% on room air. GENERAL:  She is a well-developed, well-nourished black female in no acute distress.  She is lying in bed with her eyes closed, at times she responds to questions, but other times she does not.  She does respond to tactile stimuli.  She is able to follow directions. HEENT:  Sclerae clear, nonicteric.  Conjunctivae pink.  Oropharynx pink and moist without lesions. NECK:  Supple without thyromegaly. HEART:  Regular rate and rhythm, normal S1 and S2.  No murmurs, clicks, rubs, or gallops. LUNGS:  Clear to auscultation bilaterally. ABDOMEN:  Positive bowel sounds x4.  No bruits auscultated.  Soft, nontender, nondistended without palpable mass or hepatosplenomegaly.  No rebound tenderness or guarding. EXTREMITIES:  Without clubbing or edema. SKIN:  Warm and dry without any rash or jaundice.  LABORATORY STUDIES:  White blood cell count 6.2, hemoglobin 9.9, hematocrit 28.2, platelets 262, D-dimers are 0.22.  Potassium 3.3, sodium 140, chloride 107, CO2 of 28, glucose 99, BUN 11, creatinine 0.66, total bilirubin 0.3, alkaline phosphatase 65, AST 14, ALT 13, total protein 5.5, albumin 3.1, calcium 8.7, magnesium 1.8.  Cardiac markers negative x2.  Urine drug screen was negative.  IMPRESSION:  Ms. Baugh is a 40 year old black female admitted with hypokalemia, chronic abdominal pain, nausea, vomiting, diarrhea.  She has history of chronic pain with fibromyalgia, multiple psychiatric medications, questionable syncopal episode in the ER.  Her multiple psychotropic medications will be adjusted per Dr. Caryn Section.  She also has anemia panel pending as well as  thyroid studies.  I suspect significant functional overlay as a cause of her chronic abdominal pain and diarrhea, especially given negative workup in [place].  Her hypokalemia is most likely secondary to chlorthalidone.  PLAN: 1. Dr. Caryn Section to adjust her multiple psychotropic meds. 2. Clear liquids. 3. Plan on keeping colonoscopy and EGD with Dr. Gala Romney on February 18, 2011, in the OR with propofol for deep sedation given her multiple     psychotropic meds. 4. Potassium repletion per Dr. Caryn Section. 5. Follow up anemia panel. 6. Continue supportive measures.  The above has been discussed with Dr. Caryn Section and Dr. Oneida Alar.  Thank you for allowing [place] to participate in the care of Ms. Monteleone.  Vickey Huger, N.P.   ______________________________ Barney Drain, M.D.    KJ/MEDQ  D:  02/16/2011  T:  02/16/2011  Job:  [id number]  cc:   Dr. Sonia Baller [facility]  Electronically Signed by Vickey Huger N.P. on 02/18/2011 08:31:03 AM Electronically Signed by Barney Drain M.D. on 03/23/2011 02:20:07 PM

## 2011-03-23 NOTE — Telephone Encounter (Signed)
Letter mailed to pt.  

## 2011-03-25 ENCOUNTER — Telehealth: Payer: Self-pay

## 2011-03-25 NOTE — Telephone Encounter (Signed)
Pt called- she did not go to her ct because she had a migraine, Crystal rescheduled pt for 04/02/11 at 8:45 (pt to pick up contrast) pt is aware of appointment. Pt has had a 7 pound weight loss in 1 week, she is weak, sick, tired, has diarrhea, nausea and vomiting. Pt also complaining of chest and R arm pain since Memorial day. Pt still taking Dexilant and Levsin. Advised pt that per [facility] OV note she should go to ED if she experiences abd pain, N/V. Stressed to pt how important it is that she not ignore these symptoms. Pt agreed and stated she might go to [facility] but she might wait until she hears back from either KJ or [facility]. Please advise.

## 2011-03-26 NOTE — Telephone Encounter (Signed)
Called pt to see if she went to ED. Pt stated she has not gone yet. She is waiting for her husband to get home. She stated she was too dizzy to drive herself and would go as soon as her husband got home.

## 2011-03-26 NOTE — Telephone Encounter (Signed)
Agree w/ your recommendations re:ER

## 2011-04-01 ENCOUNTER — Other Ambulatory Visit: Payer: Self-pay | Admitting: Internal Medicine

## 2011-04-01 DIAGNOSIS — R109 Unspecified abdominal pain: Secondary | ICD-10-CM

## 2011-04-01 DIAGNOSIS — K529 Noninfective gastroenteritis and colitis, unspecified: Secondary | ICD-10-CM

## 2011-04-01 DIAGNOSIS — R634 Abnormal weight loss: Secondary | ICD-10-CM

## 2011-04-02 ENCOUNTER — Inpatient Hospital Stay (HOSPITAL_COMMUNITY)
Admission: RE | Admit: 2011-04-02 | Discharge: 2011-04-02 | Payer: 59 | Source: Ambulatory Visit | Attending: Internal Medicine | Admitting: Internal Medicine

## 2011-04-02 ENCOUNTER — Ambulatory Visit (HOSPITAL_COMMUNITY): Payer: 59

## 2011-04-09 ENCOUNTER — Ambulatory Visit (HOSPITAL_COMMUNITY): Payer: 59

## 2011-06-07 ENCOUNTER — Telehealth: Payer: Self-pay

## 2011-06-07 ENCOUNTER — Ambulatory Visit (INDEPENDENT_AMBULATORY_CARE_PROVIDER_SITE_OTHER): Payer: 59 | Admitting: Urgent Care

## 2011-06-07 ENCOUNTER — Encounter: Payer: Self-pay | Admitting: Urgent Care

## 2011-06-07 DIAGNOSIS — K2210 Ulcer of esophagus without bleeding: Secondary | ICD-10-CM

## 2011-06-07 DIAGNOSIS — K529 Noninfective gastroenteritis and colitis, unspecified: Secondary | ICD-10-CM

## 2011-06-07 DIAGNOSIS — R1110 Vomiting, unspecified: Secondary | ICD-10-CM

## 2011-06-07 DIAGNOSIS — K208 Other esophagitis without bleeding: Secondary | ICD-10-CM

## 2011-06-07 DIAGNOSIS — R197 Diarrhea, unspecified: Secondary | ICD-10-CM

## 2011-06-07 DIAGNOSIS — R634 Abnormal weight loss: Secondary | ICD-10-CM

## 2011-06-07 MED ORDER — PROMETHAZINE HCL 12.5 MG PO TABS: 12.5000 mg | ORAL_TABLET | Freq: Four times a day (QID) | ORAL | Status: AC | PRN

## 2011-06-07 NOTE — Progress Notes (Signed)
Primary Care Physician:  Emelda Fear, DO Primary Gastroenterologist:  Dr. Gala Romney  Chief Complaint  Patient presents with  . Emesis  . Diarrhea    HPI:  Dana Leonard is a 40 y.o. female here for further evaluation of her multiple GI concerns including chronic N/V & diarrhea.  Now w/ significant wt loss.  C/o fatigue.  "Job is very stressful".  States she feels like she is having too many tests done & is frustrated that she is not getting any answers.  She did not proceed w/ CT scan I had ordered as last office visit.  She tells me she was not afraid of CT, but she had a migraine so she did not go.  We did send her a letter to reschedule, but did not get a response.  c/o "all the time" abd pain, nausea & vomiting.  +tearful.  C/o urgency & needing to know where bathrooms are at all times.  BM 10-12 per day.  Problems with sugar making symptoms worse.  Wt down another 28# per our records (she gives over a 68# total loss this year).  +Severe migraines.  Occasional appetite.  Symptoms worse w/ stress.  Sees Dr. Reece Levy (psych) on a regular basis since she lost mother last yr & father had MI around the time her symptoms started, has had a lot of stress.  Hx depression & fibromyalgia.  .  Chlorthalidone & lasix listed in medication reconciliation, however pt does not admit to taking these meds.  EGD & colonoscopy with random biopsies as below earlier this year w/ Dr Gala Romney did not reveal the source of her problems other than mild erosive esophagitis. Past Medical History  Diagnosis Date  . S/P colonoscopy 01/19/2011    Dr. Earley Brooke [facility])  . Hemorrhoids   . Fibromyalgia   . Depression   . HTN (hypertension)   . Migraines   . CVA (cerebral infarction)     2011/2006 Dr.  Bland Span ([place])  . Thalassemia trait   . H. pylori infection   . IBS (irritable bowel syndrome) 02/18/11    normal ileocolonsocpy w/ Dr Gala Romney  w/ benign SB bx  . Esophagitis, erosive 02/18/11    (mild) on EGD Dr Gala Romney  .  Hypokalemia     chronic   Past Surgical History  Procedure Date  . Abdominal hysterectomy 2002  . Appendectomy 1997   Current Outpatient Prescriptions  Medication Sig Dispense Refill  . ALPRAZolam (XANAX) 1 MG tablet Take 1 mg by mouth at bedtime as needed.        Marland Kitchen DEXILANT 60 MG capsule TAKE ONE CAPSULE BY MOUTH DAILY  31 capsule  5  . DULoxetine (CYMBALTA) 30 MG capsule Take 30 mg by mouth daily.        . hyoscyamine (LEVSIN, ANASPAZ) 0.125 MG tablet Take 0.125 mg by mouth 4 (four) times daily -  before meals and at bedtime.        . ondansetron (ZOFRAN-ODT) 4 MG disintegrating tablet Take 4 mg by mouth every 8 (eight) hours as needed.        . potassium chloride SA (K-DUR,KLOR-CON) 20 MEQ tablet 20 mEq Daily.      . QUEtiapine (SEROQUEL) 300 MG tablet Take 300 mg by mouth at bedtime.        Marland Kitchen tiZANidine (ZANAFLEX) 4 MG tablet as needed.      . topiramate (TOPAMAX) 50 MG tablet 50 mg Daily.      Marland Kitchen zolpidem (AMBIEN CR) 12.5  MG CR tablet Take 25 mg by mouth at bedtime as needed.        . promethazine (PHENERGAN) 12.5 MG tablet Take 1 tablet (12.5 mg total) by mouth every 6 (six) hours as needed for nausea.  30 tablet  0    Allergies as of 06/07/2011 - Review Complete 03/09/2011  Allergen Reaction Noted  . Latex Rash 02/05/2011  . Methadone  03/09/2011  . Percocet (oxycodone-acetaminophen)  03/09/2011  . Codeine    . Morphine  09/25/2007  . Sumatriptan     Family History:There is no known family history of colorectal carcinoma , liver disease, or inflammatory bowel disease.  Problem Relation Age of Onset  . Sickle cell anemia Mother   . Diabetes Mother   . Coronary artery disease Mother   . Diabetes Father   . Coronary artery disease Father   . Seizures Sister   . Coronary artery disease Sister   . Kidney failure Son    History   Social History  . Marital Status: Married    Spouse Name: N/A    Number of Children: 2  . Years of Education: N/A   Occupational History    . disabled     fibromyalgia, depression  .      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 Smoking status: Never Smoker   . Smokeless tobacco: Never Used  . Alcohol Use: No  . Drug Use: No  . Sexually Active: Yes -- Female partner(s)    Birth Control/ Protection: None     spouse   Other Topics Concern  . Not on file   Social History Narrative   Married x 35yrVolunteers as parenting advocate   Review of Systems: Gen: Denies any sweats, c/o anorexia, fatigue, weakness, malaise, weight loss, and sleep disorder CV: Denies chest pain, angina, palpitations, syncope, orthopnea, PND, peripheral edema, and claudication. Resp: Denies dyspnea at rest, dyspnea with exercise, cough, sputum, wheezing, coughing up blood, and pleurisy. GI: Denies vomiting blood, jaundice, and fecal incontinence.   Denies dysphagia or odynophagia. GU : Denies urinary burning, blood in urine, urinary frequency, urinary hesitancy, nocturnal urination, and urinary incontinence. MS: Chronic body aches w/ hx fibromyalgia. Derm: Denies rash, itching, dry skin, hives, moles, warts, or unhealing ulcers.  Heme: Denies bruising, bleeding, and enlarged lymph nodes.  Physical Exam: BP 108/71  Pulse 117  Temp(Src) 97.2 F (36.2 C) (Temporal)  Ht 5' 3"  (1.6 m)  Wt 121 lb (54.885 kg)  BMI 21.43 kg/m2 Physical Exam: BP 108/71  Pulse 117  Temp(Src) 97.2 F (36.2 C) (Temporal)  Ht 5' 3"  (1.6 m)  Wt 121 lb (54.885 kg)  BMI 21.43 kg/m2 General:   Alert,  Well-developed, well-nourished, pleasant and cooperative in NAD Head:  Normocephalic and atraumatic. Eyes:  Sclera clear, no icterus.   Conjunctiva pink. Mouth:  No deformity or lesions, dentition normal. Neck:  Supple; no masses or thyromegaly. Heart:  Regular rate and rhythm; no murmurs, clicks, rubs,  or gallops. Abdomen:  Soft, nontender and nondistended. No masses, hepatosplenomegaly or hernias noted. Normal bowel  sounds, without guarding, and without rebound.   Msk:  Symmetrical without gross deformities. Normal posture. Pulses:  Normal pulses noted. Extremities:  Without clubbing or edema. Neurologic:  Alert and  oriented x4;  grossly normal neurologically. Skin:  Intact without significant lesions or rashes. Cervical Nodes:  No significant cervical adenopathy. Psych:  Alert and cooperative.  +tearful & anxious  Head:  Normocephalic and atraumatic. Eyes:  Sclera clear, no icterus.   Conjunctiva pink. Ears:  Normal auditory acuity. Nose:  No deformity, discharge,  or lesions. Mouth:  No deformity or lesions, dentition normal. Neck:  Supple; no masses or thyromegaly. Lungs:  Clear throughout to auscultation.   No wheezes, crackles, or rhonchi. No acute distress. Heart:  Regular rate and rhythm; no murmurs, clicks, rubs,  or gallops. Abdomen:  Soft, nontender and nondistended. No masses, hepatosplenomegaly or hernias noted. Normal bowel sounds, without guarding, and without rebound.   Rectal:  Deferred until time of colonoscopy.   Msk:  Symmetrical without gross deformities. Normal posture. Pulses:  Normal pulses noted. Extremities:  Without clubbing or edema. Neurologic:  Alert and  oriented x4;  grossly normal neurologically. Skin:  Intact without significant lesions or rashes. Cervical Nodes:  No significant cervical adenopathy. Psych:  Alert and cooperative. Normal mood and affect.

## 2011-06-07 NOTE — Telephone Encounter (Signed)
Pt said the Zofran is not helping her nausea. She would like a prescription for phenergan sent to her pharmacy. [facility] in [place] on [address].

## 2011-06-07 NOTE — Telephone Encounter (Signed)
Please call pt & let her know I sent rx. Thanks

## 2011-06-07 NOTE — Patient Instructions (Addendum)
Please go get your CT scan & labs as ordered Continue dexilant 4m daily

## 2011-06-08 ENCOUNTER — Telehealth: Payer: Self-pay | Admitting: Urgent Care

## 2011-06-08 ENCOUNTER — Encounter: Payer: Self-pay | Admitting: Urgent Care

## 2011-06-08 ENCOUNTER — Other Ambulatory Visit: Payer: Self-pay | Admitting: Urgent Care

## 2011-06-08 DIAGNOSIS — R799 Abnormal finding of blood chemistry, unspecified: Secondary | ICD-10-CM

## 2011-06-08 LAB — CBC WITH DIFFERENTIAL/PLATELET
Basophils Absolute: 0 10*3/uL (ref 0.0–0.1)
Eosinophils Absolute: 0.1 10*3/uL (ref 0.0–0.7)
Eosinophils Relative: 1 % (ref 0–5)
MCH: 29.3 pg (ref 26.0–34.0)
MCV: 82.9 fL (ref 78.0–100.0)
Neutrophils Relative %: 42 % — ABNORMAL LOW (ref 43–77)
Platelets: 336 10*3/uL (ref 150–400)
RBC: 4.03 MIL/uL (ref 3.87–5.11)
RDW: 13.4 % (ref 11.5–15.5)
WBC: 6 10*3/uL (ref 4.0–10.5)

## 2011-06-08 LAB — GLIA (IGA/G) + TTG IGA
Gliadin IgA: 3.7 U/mL (ref <20)
Gliadin IgG: 1.5 U/mL (ref <20)
Tissue Transglutaminase Ab, IgA: 4.9 U/mL (ref <20)

## 2011-06-08 LAB — COMPREHENSIVE METABOLIC PANEL
Albumin: 4.4 g/dL (ref 3.5–5.2)
BUN: 12 mg/dL (ref 6–23)
CO2: 31 mEq/L (ref 19–32)
Calcium: 10.3 mg/dL (ref 8.4–10.5)
Chloride: 98 mEq/L (ref 96–112)
Glucose, Bld: 90 mg/dL (ref 70–99)
Potassium: 3 mEq/L — ABNORMAL LOW (ref 3.5–5.3)

## 2011-06-08 NOTE — Progress Notes (Signed)
Quick Note:  Please call pt- Let her know that all of her labs have not returned, but her K is low again Be sure she was fasting when the labs were drawn please Begin KCL 39mq Sig: 1 po TID today, then once daily. #40, 0RF.  Recheck MAG & K+ in 1 week. Also let her know anemia is improved, almost normal. Need iFOBT  We will call w/ the rest of her lab results & CT when they are available. ______

## 2011-06-08 NOTE — Assessment & Plan Note (Signed)
May be contributing to N/V.   Dexilant 30m daily

## 2011-06-08 NOTE — Assessment & Plan Note (Signed)
See diarrhea.

## 2011-06-08 NOTE — Assessment & Plan Note (Addendum)
Verneta Hamidi is a 40 y.o. black female w/ chronic diarrhea & significant wt loss.  Previous colonoscopy, stool studies, SBFT, small bowel bx, & EGD without significant findings as to the cause of her diarrhea.  I am quite concerned with her 60+ # weight loss, however her non-compliance is interfering with an expeditious work-up.  CT abd/pelvis w/ IV/oral contrast to r/o lymphoma & occult malignancy. Celiac panel/IgA.  If normal, consider referral to tertiary care center for 2nd opinion.  CBC, CMP, & fasting cortisol

## 2011-06-08 NOTE — Telephone Encounter (Signed)
Pt states her bottle 03/13/2011 (2 pills left) of KCl says twice per day- [place] (Dr. Emelda Fear) Pt then states she may have the wrong bottle, she's unsure I have asked her to stop by the office with ALL HER BOTTLES of MEDS to be reviewed w/ the nurse She states she will come by before her CT tomorrow which is at 1:30pm Pt advised to take KCl 59mq TID x 3 days since she is already taking BID (not daily as she told uKore[place]esterday) then resume her usual dose Thanks

## 2011-06-08 NOTE — Telephone Encounter (Signed)
Message copied by Andria Meuse on Tue Jun 08, 2011  4:19 PM ------      Message from: Claudina Lick      Created: Tue Jun 08, 2011  3:14 PM       Pt aware, ifobt and lab order  in mail to pt. Pt stated she was already taking K+ 89mq tid. I asked pt 3 times if she was sure that she was taking it tid and pt stated yes.  I called [facility] they informed me that pt was  prescribed K+ 236m one daily by Dr. MaChauncey Reading

## 2011-06-08 NOTE — Assessment & Plan Note (Signed)
?   Gastroparesis, cyclic vomiting syndrome, AIP.  Await CT findings.

## 2011-06-09 ENCOUNTER — Ambulatory Visit (HOSPITAL_COMMUNITY)
Admission: RE | Admit: 2011-06-09 | Discharge: 2011-06-09 | Disposition: A | Discharge status: Home / Self Care | Payer: 59 | Source: Ambulatory Visit | Attending: Internal Medicine | Admitting: Internal Medicine

## 2011-06-09 DIAGNOSIS — K529 Noninfective gastroenteritis and colitis, unspecified: Secondary | ICD-10-CM

## 2011-06-09 DIAGNOSIS — R634 Abnormal weight loss: Secondary | ICD-10-CM | POA: Insufficient documentation

## 2011-06-09 DIAGNOSIS — J984 Other disorders of lung: Secondary | ICD-10-CM | POA: Insufficient documentation

## 2011-06-09 DIAGNOSIS — R112 Nausea with vomiting, unspecified: Secondary | ICD-10-CM | POA: Insufficient documentation

## 2011-06-09 DIAGNOSIS — R109 Unspecified abdominal pain: Secondary | ICD-10-CM | POA: Insufficient documentation

## 2011-06-09 DIAGNOSIS — Q619 Cystic kidney disease, unspecified: Secondary | ICD-10-CM | POA: Insufficient documentation

## 2011-06-09 MED ORDER — IOHEXOL 300 MG/ML  SOLN: 100.0000 mL | Freq: Once | INTRAMUSCULAR | Status: AC | PRN

## 2011-06-09 NOTE — Progress Notes (Signed)
Quick Note:  See phone note for additional details ______

## 2011-06-09 NOTE — Progress Notes (Signed)
Cc to PCP 

## 2011-06-09 NOTE — Telephone Encounter (Signed)
Thanks Almyra Free Please update med list w/meds See previous note for complete plan Thanks

## 2011-06-09 NOTE — Progress Notes (Signed)
Please call pt & let her know nothing in her GI tract to explain her symptoms.  Discussed w/ RMR.  We would like pt to get 2nd opinion at [facility].  I will be available next week if she has any questions about this or CT.  You can tell her that  Her CT shows Mild bladder wall thickening (nonspecific) will need to address w/ Dr Paulita Fujita right kidney cyst & small nodules in lungs that do no look worrisome, but will need to be followed up w/ PCP  Based on meds reviewed in office today, she should take KCl 75mq BID x 2 more days, then once daily until OV w/ Dr MChauncey Reading #15, 0RF Needs Met 7 Monday as planned Cc:all labs, last note, procedures & bx, CT [facility]  Also needs office visit w/ Dr MChauncey Readingscheduled by our office to address: 1) chronic hypokalemia & management of diuretics 2) bladder findings on CT 3) pulmonary nodules  Please send my typed letter to Dr MChauncey Readingtoday & schedule appt Thanks

## 2011-06-09 NOTE — Telephone Encounter (Signed)
Pt came to office with rx bottles. Medications documented in OV note were from a list pt brought with her. Meds she brought in are as follows:  topiramate 127m take 11/2 bid  Dr. MTonia Ghentfilled 05/11/11 Promethazine 229m1/2 q 6 hours prn #15 filled 06/07/11 nucynta 10070m qd #120 filled 06/07/11 Zolpidem 13m63mqhs #60 filled 06/07/11 Tizanidine 4mg 42m #60 filled 06/08/11 Dr. WinikSander Radonalta 30mg 47m #30 filled 7/16 Dr. Reddy Reece Levyuel XR 300mg 235mat 6pm#60 otc B1 250mg qd6m chromium picolinate 200mcg qd21m biotin 1000mcg qd 5mB12 1000mcg qd  65mdidn't bring the following bottles: per pt report   Xanax 1mg qid- pt2mated she is taking Lasix ?mg qd - pt reports she is not taking Chlorthalidone ?mg qd - pt reports she is not taking Potassium 20 meq (per pharmacy pt is to take qd) pt reports she is out of potassium, and took last one yesterday.  Pt seemed to be upset that she could not speak with KJ today and is requesting a phone call from her at her home number to explain the next step and give her ct results.

## 2011-06-10 NOTE — Telephone Encounter (Signed)
Called, pt's home VM full. Called mobile number, got Mr. Korte, [facility] for pt to call.

## 2011-06-10 NOTE — Progress Notes (Signed)
Tried to call pt- NA 

## 2011-06-11 ENCOUNTER — Telehealth: Payer: Self-pay

## 2011-06-11 NOTE — Telephone Encounter (Signed)
Pt aware of KJ recommendations. Please fax letter and ct and labs to Dr. Chauncey Reading. Pt also needs referral to [facility].

## 2011-06-11 NOTE — Progress Notes (Signed)
Tried to call pt- LMOM 

## 2011-06-11 NOTE — Progress Notes (Signed)
Pt aware.

## 2011-06-11 NOTE — Telephone Encounter (Signed)
Records faxed to Dr. Chauncey Reading

## 2011-06-14 ENCOUNTER — Other Ambulatory Visit: Payer: Self-pay | Admitting: Urgent Care

## 2011-06-14 DIAGNOSIS — K529 Noninfective gastroenteritis and colitis, unspecified: Secondary | ICD-10-CM

## 2011-06-14 DIAGNOSIS — R634 Abnormal weight loss: Secondary | ICD-10-CM

## 2011-06-14 NOTE — Telephone Encounter (Signed)
Pt is aware of appt at [facility].

## 2011-06-14 NOTE — Telephone Encounter (Signed)
Pt aware of appt with Dr. Chauncey Reading.

## 2011-06-14 NOTE — Telephone Encounter (Signed)
Referral faxed to [facility].

## 2011-06-14 NOTE — Telephone Encounter (Signed)
Pt has an appt. At [facility] with Dr Tally Joe 07/30/2011@8 :30am

## 2011-06-14 NOTE — Telephone Encounter (Signed)
Called pt and informed the Rx for phenergan was sent in, did she get it. She said her husband was going to pick up her meds this afternoon. Then she wanted to speak to Vickey Huger, NP. I asked her could I give a message since she is seeing pt's. She said she just wanted to express her self. Still losing weight, as of yesterday down to 114 lbs. Very worried and concerned and said her family is afraid of her because her body looks so bad. She would just like to talk with Brennan Bailey. Then I asked her again, so you have not got the phenergan yet, and she said oh, Yes. And I have been taking it and it has not been helping at all.

## 2011-06-14 NOTE — Telephone Encounter (Signed)
I was told by receptionist at [facility] that they will call back with an appt. Later today. They can give this appt to me or Crystal.

## 2011-06-14 NOTE — Telephone Encounter (Signed)
Returned pt's call & LMOM

## 2011-06-14 NOTE — Telephone Encounter (Signed)
Pt is scheduled to see Dr Chauncey Reading 08/21@ 1:30, left mess with instructions on pts VM

## 2011-06-14 NOTE — Telephone Encounter (Signed)
Pt has an appt 07/30/11@8 :30am

## 2011-08-03 ENCOUNTER — Ambulatory Visit: Payer: 59 | Admitting: Gastroenterology

## 2011-08-04 ENCOUNTER — Telehealth: Payer: Self-pay | Admitting: Gastroenterology

## 2011-08-04 ENCOUNTER — Telehealth: Payer: Self-pay

## 2011-08-04 NOTE — Telephone Encounter (Signed)
Pt called- she said she didn't come to appt yesterday because she didn't feel good. She went last night and had blood work drawn for her pain management doctor. She said her potassium was 2.1 and they advised her to be admitted and get some IV potassium. Pt called [place] wanting to know if we could admit her. Advised pt she definitely needed potassium but she needed to contact whoever ordered the blood work. Warned her of dangers of low potassium and that she needed to seek medical attention either from MD who ordered labs or at ED. Pt said her neurologist was going to admit her tomorrow for her migraines. Pt has appt here tomorrow with AS. She is not sure if she will make it or not.

## 2011-08-04 NOTE — Telephone Encounter (Signed)
Agreed. We did not order the blood work. She needs to go to the ED. This can cause severe problems.

## 2011-08-04 NOTE — Telephone Encounter (Signed)
PT CALLED ASKING FOR [place] TO REFER HER TO THE [facility] - SHE SAID SHE HAD MEET ONE OF THE DR FROM THERE THROUGH A MUTUAL FRIEND. I ASKED HER IF SHE HAD KEPT HER APPT AT [facility] THAT HAD BEEN SCHEDULED FOR 07/29/11 AND SHE STATED  "NO, SHE JUST DIDN'T FEEL LIKE GOING."  PT ASKED TO BE SEEN HERE FOR FURTHER FOLLOW UP OF HER POOR PO INTAKE- APPT SCHEDULED

## 2011-08-05 ENCOUNTER — Telehealth: Payer: Self-pay | Admitting: Gastroenterology

## 2011-08-05 ENCOUNTER — Ambulatory Visit: Payer: 59 | Admitting: Gastroenterology

## 2011-08-09 ENCOUNTER — Telehealth: Payer: Self-pay

## 2011-08-09 NOTE — Telephone Encounter (Signed)
Pt called- she is requesting again to be referred to [facility] for her GI issues. She said she was admitted to [facility] last week d/t her K+ level and that is why she didn't come to her appt.

## 2011-08-11 NOTE — Telephone Encounter (Signed)
NO SHOW

## 2011-08-12 ENCOUNTER — Telehealth: Payer: Self-pay | Admitting: Gastroenterology

## 2011-08-12 NOTE — Telephone Encounter (Signed)
Records faxed to [facility] per pts request

## 2011-08-12 NOTE — Telephone Encounter (Signed)
Notes faxed.

## 2011-08-18 ENCOUNTER — Telehealth: Payer: Self-pay | Admitting: Gastroenterology

## 2011-08-18 NOTE — Telephone Encounter (Signed)
Received fax from [facility] is scheduled to see Dr Prescott Parma in their GI clinic on 08/25/11

## 2011-08-18 NOTE — Telephone Encounter (Signed)
Glad to hear.

## 2011-08-18 NOTE — Telephone Encounter (Signed)
Received fa

## 2012-11-23 IMAGING — CT CT ABD-PELV W/ CM
2 of 3 series · 16 of 46 positions shown, 18 images · IV contrast (Omnipaque 300)
Comparison: None

CLINICAL DATA: Mild pain, chronic diarrhea, weight loss, nausea,
vomiting, prior appendectomy

CT ABDOMEN AND PELVIS WITH CONTRAST
TECHNIQUE: Multidetector CT imaging of the abdomen and pelvis was
performed following the standard protocol during bolus
administration of intravenous contrast. Sagittal and coronal MPR
images reconstructed from axial data set.
Contrast: Dilute oral contrast. 100 ml Omnipaque 300 IV.  Of

[Series 2: abd_pel_with 5.0 b40f · axial · 0.65mm/px · z∈[-410,-36]mm · 13 of 87 slices shown, 15 images]
[im 6/87  soft-tissue]
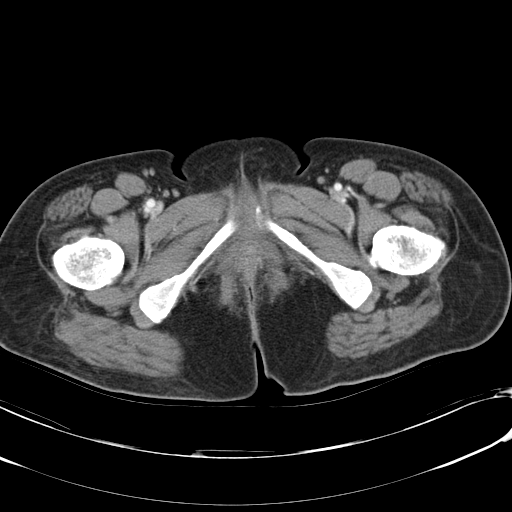
[im 6/87  bone]
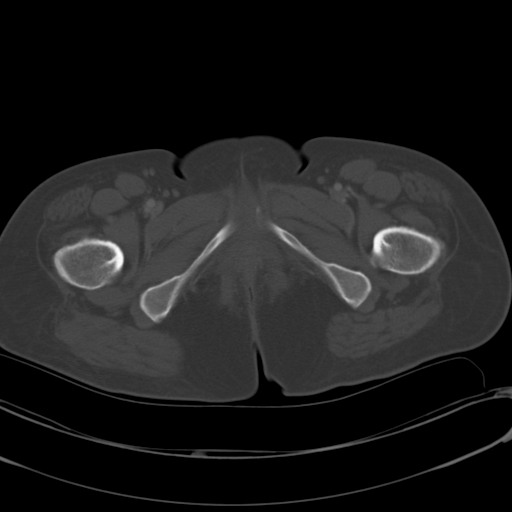
[im 12/87  soft-tissue]
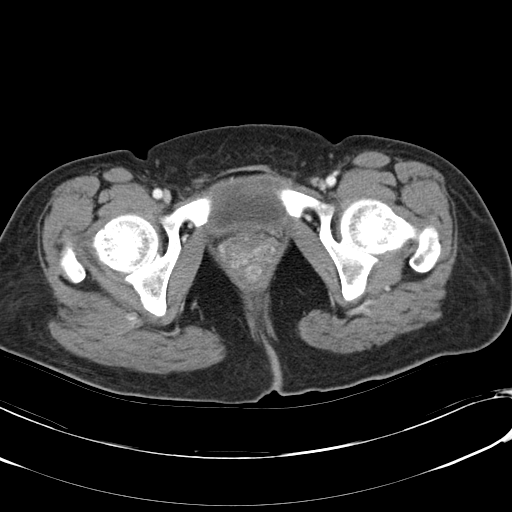
[im 17/87  soft-tissue]
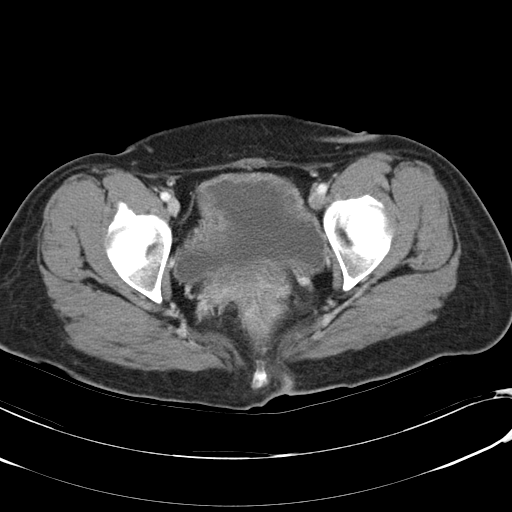
[im 25/87  soft-tissue]
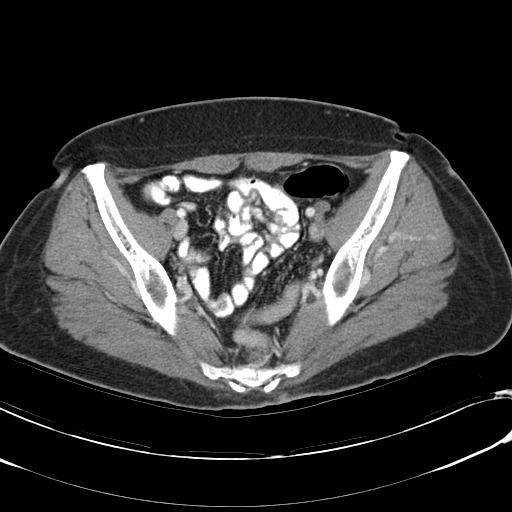
[im 31/87  soft-tissue]
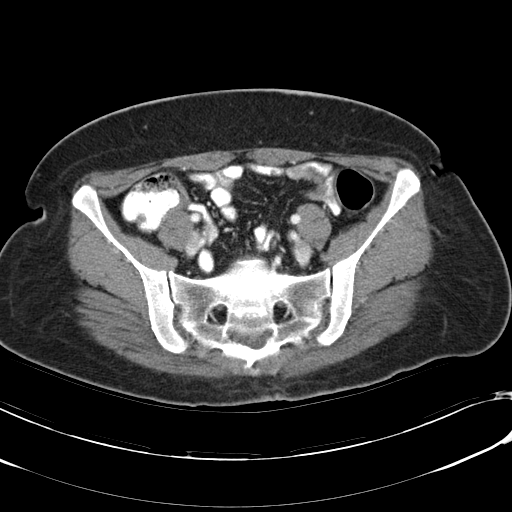
[im 37/87  soft-tissue]
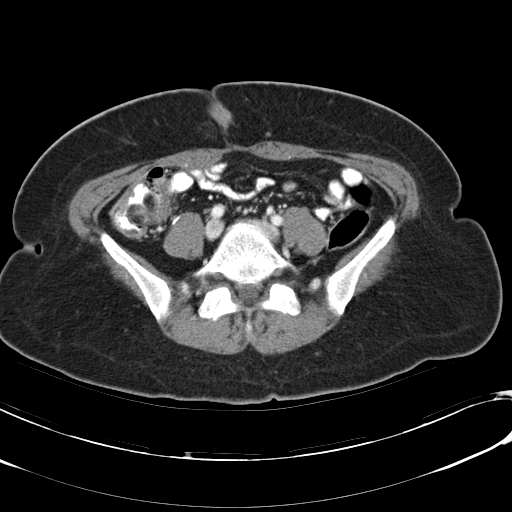
[im 45/87  soft-tissue]
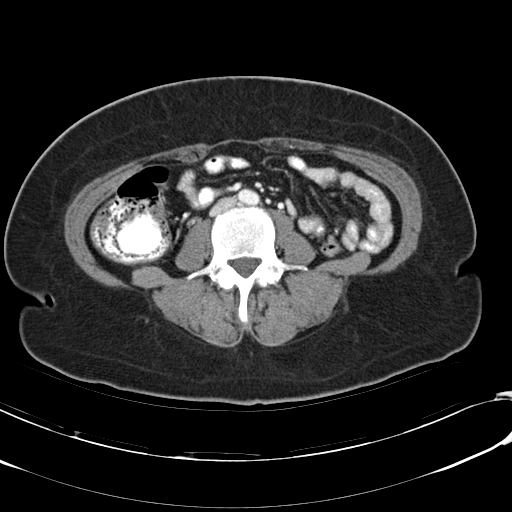
[im 50/87  soft-tissue]
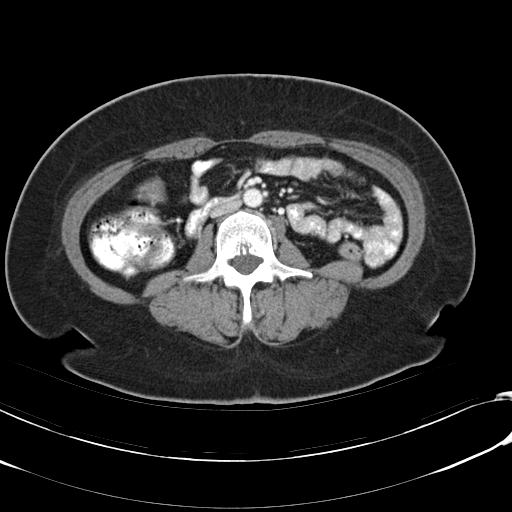
[im 56/87  soft-tissue]
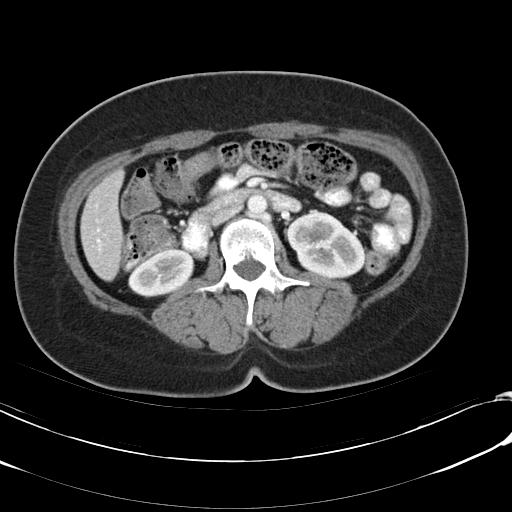
[im 56/87  bone]
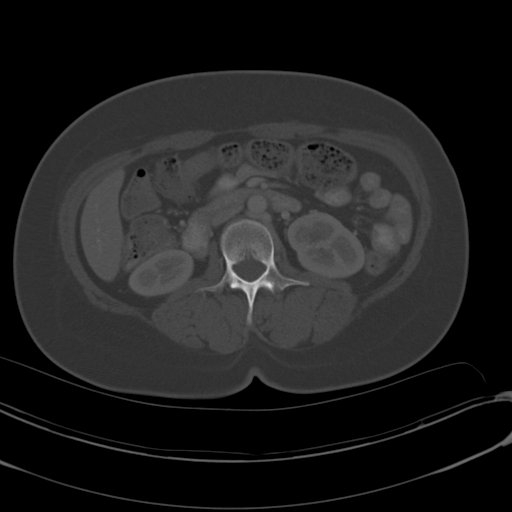
[im 62/87  soft-tissue]
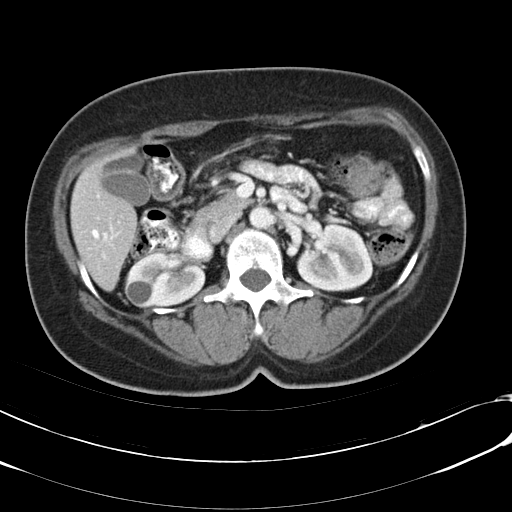
[im 70/87  soft-tissue]
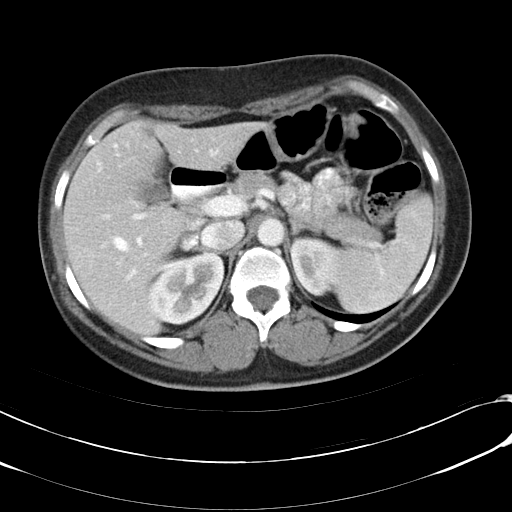
[im 75/87  soft-tissue]
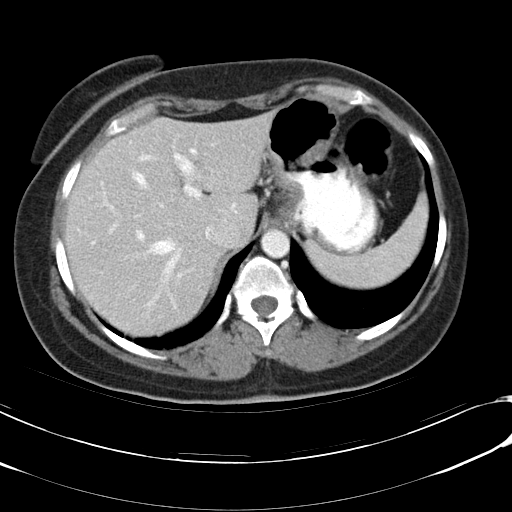
[im 81/87  soft-tissue]
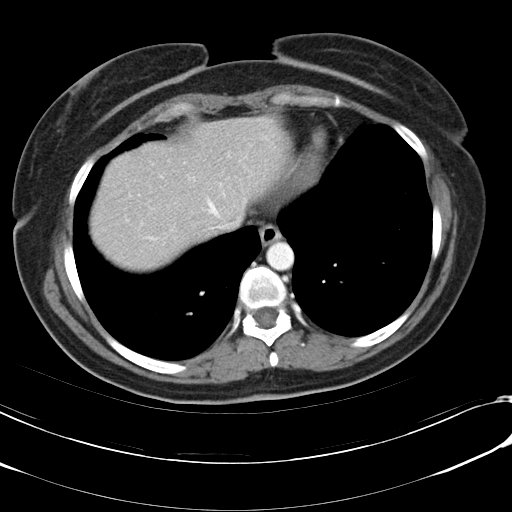

[Series 4: abd_pel_with 3.0 spo cor · coronal · 0.62mm/px · 3 of 66 slices shown]
[im 22/66  soft-tissue]
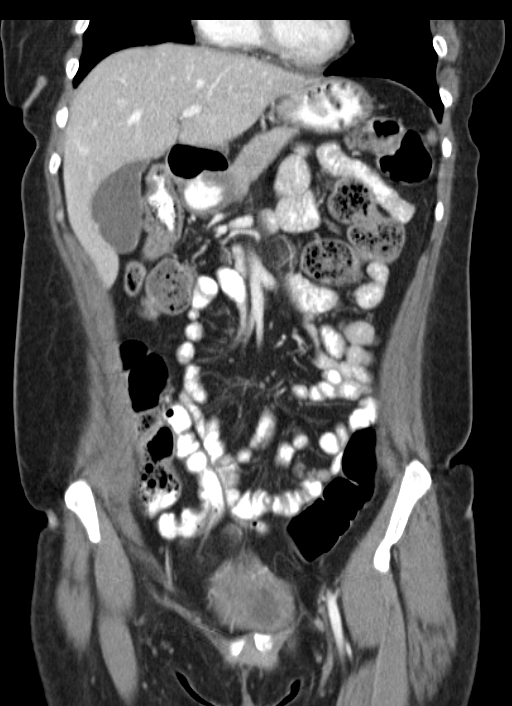
[im 29/66  soft-tissue]
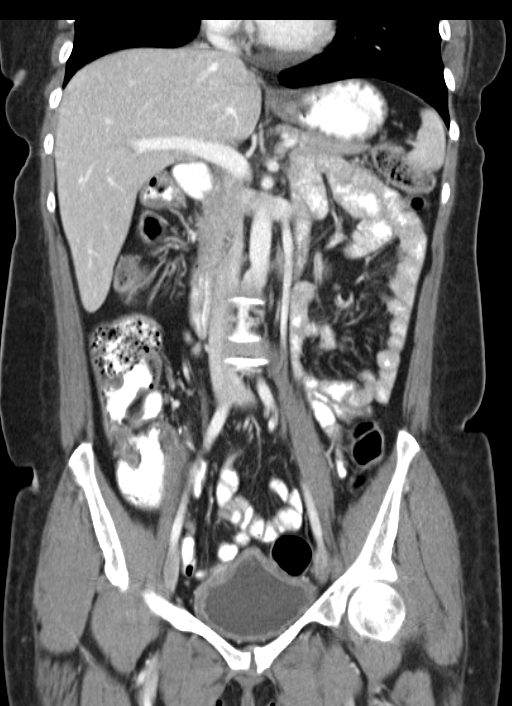
[im 37/66  soft-tissue]
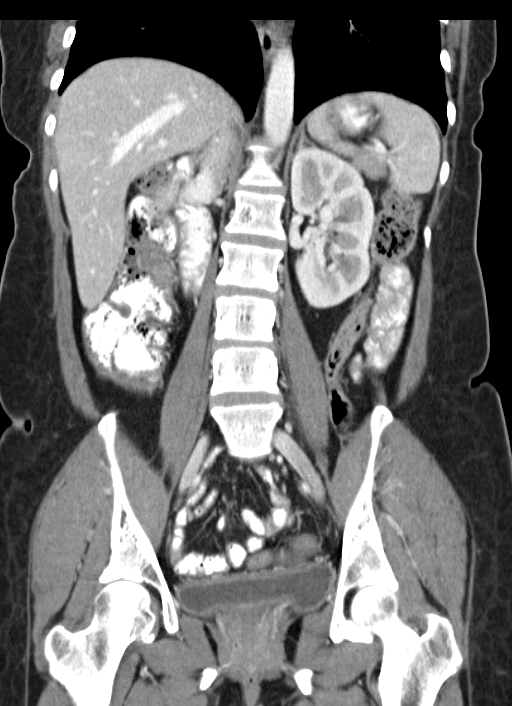

[16 of 46 positions shown; findings below may reference images not displayed]

FINDINGS: 3 mm diameter right lower lobe nodule image 5.
3 mm diameter left lower lobe nodule image 5.
Right renal cyst 1.6 x 1.6 cm image 26.
Minimal focal fatty infiltration of liver adjacent to falciform
fissure.
Remainder of liver, spleen, pancreas, kidneys, and adrenal glands
normal.
Appendix and uterus surgically absent with nonvisualization of
ovaries.
Rectosigmoid colon under distended and unopacified, unable to
exclude wall thickening.
Mild bladder wall thickening, nonspecific.
Remaining bowel loops normal appearance.
No mass, adenopathy, free fluid or inflammatory process.
Question small periumbilical hernia containing fat.
No acute osseous findings.
IMPRESSION: Mild bladder wall thickening, nonspecific, can be seen with chronic
outlet obstruction, infectious inflammatory processes, unlikely
tumor due to the diffuse uniform nature of thickening.
Small right renal cyst.
Bilateral lower lobe pulmonary nodules, nonspecific, uncertain
etiology; these are likely benign but in the setting of unexplained
weight loss, other intrathoracic pathology/malignancy could also be
considered.
Consider follow-up CT imaging of the chest if there is clinical
concern for additional intrathoracic abnormalities.

## 2012-12-26 ENCOUNTER — Telehealth: Payer: Self-pay | Admitting: Urgent Care

## 2012-12-26 NOTE — Telephone Encounter (Signed)
Letter from [facility]. Pt no showed on 12/20/12 for FU with GI clinic

## 2014-05-31 ENCOUNTER — Encounter (HOSPITAL_COMMUNITY): Payer: Self-pay | Admitting: Emergency Medicine

## 2014-05-31 ENCOUNTER — Emergency Department (HOSPITAL_COMMUNITY)
Admission: EM | Admit: 2014-05-31 | Discharge: 2014-06-01 | Disposition: A | Discharge status: Other Acute Inpatient Hospital | Payer: 59 | Attending: Emergency Medicine | Admitting: Emergency Medicine

## 2014-05-31 DIAGNOSIS — F329 Major depressive disorder, single episode, unspecified: Secondary | ICD-10-CM | POA: Diagnosis not present

## 2014-05-31 DIAGNOSIS — Z791 Long term (current) use of non-steroidal anti-inflammatories (NSAID): Secondary | ICD-10-CM | POA: Insufficient documentation

## 2014-05-31 DIAGNOSIS — Z79899 Other long term (current) drug therapy: Secondary | ICD-10-CM | POA: Insufficient documentation

## 2014-05-31 DIAGNOSIS — I10 Essential (primary) hypertension: Secondary | ICD-10-CM | POA: Insufficient documentation

## 2014-05-31 DIAGNOSIS — Z8619 Personal history of other infectious and parasitic diseases: Secondary | ICD-10-CM | POA: Diagnosis not present

## 2014-05-31 DIAGNOSIS — Z9889 Other specified postprocedural states: Secondary | ICD-10-CM | POA: Insufficient documentation

## 2014-05-31 DIAGNOSIS — Z91040 Latex allergy status: Secondary | ICD-10-CM | POA: Diagnosis not present

## 2014-05-31 DIAGNOSIS — F3289 Other specified depressive episodes: Secondary | ICD-10-CM | POA: Insufficient documentation

## 2014-05-31 DIAGNOSIS — T424X2A Poisoning by benzodiazepines, intentional self-harm, initial encounter: Secondary | ICD-10-CM

## 2014-05-31 DIAGNOSIS — K589 Irritable bowel syndrome without diarrhea: Secondary | ICD-10-CM | POA: Insufficient documentation

## 2014-05-31 DIAGNOSIS — G43909 Migraine, unspecified, not intractable, without status migrainosus: Secondary | ICD-10-CM | POA: Insufficient documentation

## 2014-05-31 DIAGNOSIS — Z862 Personal history of diseases of the blood and blood-forming organs and certain disorders involving the immune mechanism: Secondary | ICD-10-CM | POA: Diagnosis not present

## 2014-05-31 DIAGNOSIS — T438X2A Poisoning by other psychotropic drugs, intentional self-harm, initial encounter: Secondary | ICD-10-CM | POA: Insufficient documentation

## 2014-05-31 DIAGNOSIS — T424X4A Poisoning by benzodiazepines, undetermined, initial encounter: Secondary | ICD-10-CM | POA: Diagnosis present

## 2014-05-31 DIAGNOSIS — R45851 Suicidal ideations: Secondary | ICD-10-CM | POA: Diagnosis not present

## 2014-05-31 DIAGNOSIS — Z8639 Personal history of other endocrine, nutritional and metabolic disease: Secondary | ICD-10-CM | POA: Diagnosis not present

## 2014-05-31 DIAGNOSIS — T43502A Poisoning by unspecified antipsychotics and neuroleptics, intentional self-harm, initial encounter: Secondary | ICD-10-CM | POA: Diagnosis not present

## 2014-05-31 LAB — ACETAMINOPHEN LEVEL: Acetaminophen (Tylenol), Serum: <15 ug/mL (ref 10–30)

## 2014-05-31 LAB — CBC
HEMATOCRIT: 30.8 % — AB (ref 36.0–46.0)
HEMOGLOBIN: 10.5 g/dL — AB (ref 12.0–15.0)
MCH: 29.2 pg (ref 26.0–34.0)
MCHC: 34.1 g/dL (ref 30.0–36.0)
MCV: 85.6 fL (ref 78.0–100.0)
Platelets: 185 10*3/uL (ref 150–400)
RBC: 3.6 MIL/uL — AB (ref 3.87–5.11)
RDW: 14.7 % (ref 11.5–15.5)
WBC: 4.6 10*3/uL (ref 4.0–10.5)

## 2014-05-31 LAB — RAPID URINE DRUG SCREEN, HOSP PERFORMED
Amphetamines: NOT DETECTED
Barbiturates: NOT DETECTED
Benzodiazepines: POSITIVE — AB
Cocaine: NOT DETECTED
Opiates: NOT DETECTED
Tetrahydrocannabinol: NOT DETECTED

## 2014-05-31 LAB — ETHANOL

## 2014-05-31 LAB — COMPREHENSIVE METABOLIC PANEL
ALT: 15 U/L (ref 0–35)
AST: 15 U/L (ref 0–37)
Albumin: 3.4 g/dL — ABNORMAL LOW (ref 3.5–5.2)
Alkaline Phosphatase: 127 U/L — ABNORMAL HIGH (ref 39–117)
Anion gap: 15 (ref 5–15)
BUN: 15 mg/dL (ref 6–23)
CALCIUM: 9.1 mg/dL (ref 8.4–10.5)
CO2: 25 meq/L (ref 19–32)
Chloride: 104 mEq/L (ref 96–112)
Creatinine, Ser: 1.23 mg/dL — ABNORMAL HIGH (ref 0.50–1.10)
GFR, EST AFRICAN AMERICAN: 62 mL/min — AB (ref >90)
GFR, EST NON AFRICAN AMERICAN: 53 mL/min — AB (ref >90)
GLUCOSE: 107 mg/dL — AB (ref 70–99)
Potassium: 3.2 mEq/L — ABNORMAL LOW (ref 3.7–5.3)
SODIUM: 144 meq/L (ref 137–147)
Total Bilirubin: <0.2 mg/dL — ABNORMAL LOW (ref 0.3–1.2)
Total Protein: 6.9 g/dL (ref 6.0–8.3)

## 2014-05-31 LAB — URINALYSIS, ROUTINE W REFLEX MICROSCOPIC
Bilirubin Urine: NEGATIVE
Glucose, UA: NEGATIVE mg/dL
Hgb urine dipstick: NEGATIVE
Ketones, ur: NEGATIVE mg/dL
LEUKOCYTES UA: NEGATIVE
NITRITE: NEGATIVE
PH: 5.5 (ref 5.0–8.0)
PROTEIN: NEGATIVE mg/dL
Specific Gravity, Urine: 1.013 (ref 1.005–1.030)
Urobilinogen, UA: 0.2 mg/dL (ref 0.0–1.0)

## 2014-05-31 LAB — SALICYLATE LEVEL: Salicylate Lvl: <2 mg/dL — ABNORMAL LOW (ref 2.8–20.0)

## 2014-05-31 LAB — VALPROIC ACID LEVEL: Valproic Acid Lvl: 23.9 ug/mL — ABNORMAL LOW (ref 50.0–100.0)

## 2014-05-31 MED ORDER — POTASSIUM CHLORIDE CRYS ER 20 MEQ PO TBCR
20.0000 meq | EXTENDED_RELEASE_TABLET | Freq: Every day | ORAL | Status: DC
  Administered 2014-06-01: 20 meq via ORAL
  Filled 2014-05-31: qty 1

## 2014-05-31 MED ORDER — CHARCOAL ACTIVATED PO LIQD
50.0000 g | Freq: Once | ORAL | Status: AC
  Administered 2014-05-31: 50 g via ORAL
  Filled 2014-05-31: qty 240

## 2014-05-31 MED ORDER — TIZANIDINE HCL 4 MG PO TABS
4.0000 mg | ORAL_TABLET | Freq: Two times a day (BID) | ORAL | Status: DC
  Administered 2014-05-31 – 2014-06-01 (×2): 4 mg via ORAL
  Filled 2014-05-31 (×3): qty 1

## 2014-05-31 MED ORDER — DIHYDROERGOTAMINE MESYLATE 4 MG/ML NA SOLN
1.0000 | NASAL | Status: DC | PRN
  Filled 2014-05-31: qty 0.13

## 2014-05-31 MED ORDER — ALPRAZOLAM 0.5 MG PO TABS
1.0000 mg | ORAL_TABLET | Freq: Every evening | ORAL | Status: DC | PRN
Start: 2014-05-31 — End: 2014-05-31

## 2014-05-31 MED ORDER — SODIUM CHLORIDE 0.9 % IV BOLUS (SEPSIS)
1000.0000 mL | INTRAVENOUS | Status: AC
  Administered 2014-05-31: 1000 mL via INTRAVENOUS

## 2014-05-31 MED ORDER — ALPRAZOLAM 0.5 MG PO TABS
1.0000 mg | ORAL_TABLET | Freq: Every evening | ORAL | Status: DC | PRN
  Administered 2014-05-31: 1 mg via ORAL
  Filled 2014-05-31: qty 2

## 2014-05-31 MED ORDER — HYDROXYCHLOROQUINE SULFATE 200 MG PO TABS
200.0000 mg | ORAL_TABLET | Freq: Every day | ORAL | Status: DC
  Administered 2014-05-31 – 2014-06-01 (×2): 200 mg via ORAL
  Filled 2014-05-31 (×2): qty 1

## 2014-05-31 MED ORDER — MELOXICAM 15 MG PO TABS
15.0000 mg | ORAL_TABLET | Freq: Every day | ORAL | Status: DC
  Administered 2014-05-31 – 2014-06-01 (×2): 15 mg via ORAL
  Filled 2014-05-31 (×2): qty 1

## 2014-05-31 MED ORDER — DIVALPROEX SODIUM ER 500 MG PO TB24
500.0000 mg | ORAL_TABLET | Freq: Every day | ORAL | Status: DC
  Administered 2014-05-31 – 2014-06-01 (×2): 500 mg via ORAL
  Filled 2014-05-31 (×2): qty 1

## 2014-05-31 MED ORDER — DULOXETINE HCL 30 MG PO CPEP
30.0000 mg | ORAL_CAPSULE | Freq: Every day | ORAL | Status: DC
  Administered 2014-05-31 – 2014-06-01 (×2): 30 mg via ORAL
  Filled 2014-05-31 (×2): qty 1

## 2014-05-31 MED ORDER — BUPROPION HCL ER (XL) 150 MG PO TB24
150.0000 mg | ORAL_TABLET | ORAL | Status: DC
  Filled 2014-05-31: qty 1

## 2014-05-31 MED ORDER — FUROSEMIDE 40 MG PO TABS
20.0000 mg | ORAL_TABLET | Freq: Every day | ORAL | Status: DC
  Administered 2014-06-01: 20 mg via ORAL
  Filled 2014-05-31: qty 1

## 2014-05-31 MED ORDER — ONDANSETRON HCL 4 MG/2ML IJ SOLN
4.0000 mg | Freq: Once | INTRAMUSCULAR | Status: AC
Start: 2014-05-31 — End: 2014-05-31
  Administered 2014-05-31: 4 mg via INTRAVENOUS
  Filled 2014-05-31: qty 2

## 2014-05-31 MED ORDER — BUPROPION HCL ER (XL) 150 MG PO TB24
150.0000 mg | ORAL_TABLET | Freq: Every day | ORAL | Status: DC
  Administered 2014-06-01: 150 mg via ORAL
  Filled 2014-05-31: qty 1

## 2014-05-31 MED ORDER — DIPHENHYDRAMINE HCL 50 MG/ML IJ SOLN
25.0000 mg | Freq: Once | INTRAMUSCULAR | Status: AC
  Administered 2014-05-31: 25 mg via INTRAVENOUS
  Filled 2014-05-31: qty 1

## 2014-05-31 MED ORDER — QUETIAPINE FUMARATE 100 MG PO TABS
400.0000 mg | ORAL_TABLET | Freq: Every day | ORAL | Status: DC
  Administered 2014-05-31: 400 mg via ORAL
  Filled 2014-05-31 (×2): qty 1

## 2014-05-31 MED ORDER — POTASSIUM CHLORIDE CRYS ER 20 MEQ PO TBCR
40.0000 meq | EXTENDED_RELEASE_TABLET | Freq: Once | ORAL | Status: AC
  Administered 2014-05-31: 40 meq via ORAL
  Filled 2014-05-31: qty 2

## 2014-05-31 MED ORDER — TOPIRAMATE 25 MG PO TABS
50.0000 mg | ORAL_TABLET | Freq: Two times a day (BID) | ORAL | Status: DC
  Administered 2014-05-31 – 2014-06-01 (×2): 50 mg via ORAL
  Filled 2014-05-31 (×2): qty 2

## 2014-05-31 MED ORDER — METOCLOPRAMIDE HCL 5 MG/ML IJ SOLN: 5.0000 mg | Freq: Once | INTRAMUSCULAR | Status: DC

## 2014-05-31 MED ORDER — AMITRIPTYLINE HCL 25 MG PO TABS
25.0000 mg | ORAL_TABLET | Freq: Every day | ORAL | Status: DC
  Administered 2014-05-31: 25 mg via ORAL
  Filled 2014-05-31: qty 1

## 2014-05-31 NOTE — ED Provider Notes (Signed)
8:55 PM Accepted care from Dr. Mingo Amber. Pt re-evaluated after 6 hours. Is awake and alert eating dinner. She c/o migraine c/w her previous migraines. Will tx w/ migraine cocktail. Will consult TTS and place in psych hold. She was IVC'd by Dr. Mingo Amber. She is medically cleared.   Pamella Pert, MD 05/31/14 2303

## 2014-05-31 NOTE — ED Notes (Signed)
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

## 2014-05-31 NOTE — ED Notes (Signed)
Pt ambulatory to restroom, will give [place] a urine sample.

## 2014-05-31 NOTE — ED Provider Notes (Addendum)
CSN: [id number]     Arrival date & time 05/31/14  1325 History   First MD Initiated Contact with Patient 05/31/14 1336     Chief Complaint  Patient presents with  . Drug Overdose     (Consider location/radiation/quality/duration/timing/severity/associated sxs/prior Treatment) Patient is a 43 y.o. female presenting with Overdose. The history is provided by the patient.  Drug Overdose This is a recurrent problem. The current episode started less than 1 hour ago (Also did overdose on roughly 20 of 1 mg Xanax and 20 of 5 mg Valium 2 days ago). The problem occurs constantly. The problem has not changed since onset.Pertinent negatives include no chest pain, no abdominal pain, no headaches and no shortness of breath. Nothing aggravates the symptoms. Nothing relieves the symptoms.    Past Medical History  Diagnosis Date  . S/P colonoscopy 01/19/2011    Dr. Earley Brooke [facility])  . Hemorrhoids   . Fibromyalgia   . Depression   . HTN (hypertension)   . Migraines   . CVA (cerebral infarction)     2011/2006 Dr.  Bland Span ([place])  . Thalassemia trait   . H. pylori infection   . IBS (irritable bowel syndrome) 02/18/11    normal ileocolonsocpy w/ Dr Gala Romney  w/ benign SB bx  . Esophagitis, erosive 02/18/11    (mild) on EGD Dr Gala Romney  . Hypokalemia     chronic   Past Surgical History  Procedure Laterality Date  . Abdominal hysterectomy  2002  . Appendectomy  1997   Family History  Problem Relation Age of Onset  . Sickle cell anemia Mother   . Diabetes Mother   . Coronary artery disease Mother   . Diabetes Father   . Coronary artery disease Father   . Seizures Sister   . Coronary artery disease Sister   . Kidney failure Son    History  Substance Use Topics  . Smoking status: Never Smoker   . Smokeless tobacco: Never Used  . Alcohol Use: No   OB History   Grav Para Term Preterm Abortions TAB SAB Ect Mult Living                 Review of Systems  Constitutional: Negative  for fever and chills.  Respiratory: Negative for shortness of breath.   Cardiovascular: Negative for chest pain.  Gastrointestinal: Negative for abdominal pain.  Neurological: Negative for headaches.  All other systems reviewed and are negative.     Allergies  Latex; Methadone; Percocet; Codeine; Morphine; and Sumatriptan  Home Medications   Prior to Admission medications   Medication Sig Start Date End Date Taking? Authorizing Provider  ALPRAZolam Duanne Moron) 1 MG tablet Take 1 mg by mouth at bedtime as needed for anxiety or sleep.    Yes Historical Provider, MD  amitriptyline (ELAVIL) 25 MG tablet Take 25 mg by mouth at bedtime.   Yes Historical Provider, MD  buPROPion (WELLBUTRIN XL) 150 MG 24 hr tablet Take 150 mg by mouth every morning.   Yes Historical Provider, MD  cyclobenzaprine (FLEXERIL) 10 MG tablet Take 10 mg by mouth at bedtime.   Yes Historical Provider, MD  diazepam (VALIUM) 5 MG tablet Take 5 mg by mouth every 6 (six) hours as needed for anxiety.   Yes Historical Provider, MD  dihydroergotamine (MIGRANAL) 4 MG/ML nasal spray Place 1 spray into the nose as needed for migraine. Use in one nostril as directed.  No more than 4 sprays in one hour   Yes  Historical Provider, MD  divalproex (DEPAKOTE ER) 500 MG 24 hr tablet Take 500 mg by mouth daily.   Yes Historical Provider, MD  DULoxetine (CYMBALTA) 30 MG capsule Take 30 mg by mouth daily.   Yes Historical Provider, MD  Eszopiclone (ESZOPICLONE) 3 MG TABS Take 3 mg by mouth at bedtime. Take immediately before bedtime   Yes Historical Provider, MD  furosemide (LASIX) 20 MG tablet Take 20 mg by mouth daily.   Yes Historical Provider, MD  HYDROcodone-acetaminophen (NORCO) 10-325 MG per tablet Take 1 tablet by mouth at bedtime.   Yes Historical Provider, MD  hydroxychloroquine (PLAQUENIL) 200 MG tablet Take 200 mg by mouth daily.   Yes Historical Provider, MD  meloxicam (MOBIC) 15 MG tablet Take 15 mg by mouth daily.   Yes  Historical Provider, MD  potassium chloride SA (K-DUR,KLOR-CON) 20 MEQ tablet Take 20 mEq by mouth Daily.  01/15/11  Yes Historical Provider, MD  QUEtiapine (SEROQUEL) 400 MG tablet Take 400 mg by mouth at bedtime.   Yes Historical Provider, MD  tiZANidine (ZANAFLEX) 4 MG tablet Take 4 mg by mouth 2 (two) times daily.  03/01/11  Yes Historical Provider, MD  topiramate (TOPAMAX) 50 MG tablet Take 50 mg by mouth 2 (two) times daily.  03/04/11  Yes Historical Provider, MD  zolpidem (AMBIEN) 10 MG tablet Take 20 mg by mouth at bedtime as needed for sleep.   Yes Historical Provider, MD   BP 117/71  Pulse 98  Temp(Src) 98.2 F (36.8 C) (Oral)  Resp 25  SpO2 93% Physical Exam  Nursing note and vitals reviewed. Constitutional: She is oriented to person, place, and time. She appears well-developed and well-nourished. No distress.  HENT:  Head: Normocephalic and atraumatic.  Mouth/Throat: Oropharynx is clear and moist.  Eyes: EOM are normal. Pupils are equal, round, and reactive to light.  Neck: Normal range of motion. Neck supple.  Cardiovascular: Normal rate and regular rhythm.  Exam reveals no friction rub.   No murmur heard. Pulmonary/Chest: Effort normal and breath sounds normal. No respiratory distress. She has no wheezes. She has no rales.  Abdominal: Soft. She exhibits no distension. There is no tenderness. There is no rebound.  Musculoskeletal: Normal range of motion. She exhibits no edema.  Neurological: She is alert and oriented to person, place, and time. No cranial nerve deficit. She exhibits normal muscle tone. Coordination normal.  Skin: No rash noted. She is not diaphoretic.  Psychiatric: She expresses suicidal ideation. She expresses suicidal plans.    ED Course  Procedures (including critical care time) Labs Review Labs Reviewed  CBC - Abnormal; Notable for the following:    RBC 3.60 (*)    Hemoglobin 10.5 (*)    HCT 30.8 (*)    All other components within normal limits   COMPREHENSIVE METABOLIC PANEL - Abnormal; Notable for the following:    Potassium 3.2 (*)    Glucose, Bld 107 (*)    Creatinine, Ser 1.23 (*)    Albumin 3.4 (*)    Alkaline Phosphatase 127 (*)    Total Bilirubin <0.2 (*)    GFR calc non Af Amer 53 (*)    GFR calc Af Amer 62 (*)    All other components within normal limits  SALICYLATE LEVEL - Abnormal; Notable for the following:    Salicylate Lvl <1.6 (*)    All other components within normal limits  ACETAMINOPHEN LEVEL  ETHANOL  URINE RAPID DRUG SCREEN (HOSP PERFORMED)  URINALYSIS, ROUTINE W REFLEX  MICROSCOPIC  VALPROIC ACID LEVEL    Imaging Review No results found.   EKG Interpretation   Date/Time:  Friday May 31 2014 13:38:35 EDT Ventricular Rate:  91 PR Interval:  171 QRS Duration: 89 QT Interval:  381 QTC Calculation: 469 R Axis:   48 Text Interpretation:  Sinus rhythm Probable left atrial enlargement Low  voltage, precordial leads Nonspecific T abnormalities, diffuse leads  Similar to previous Confirmed by Mingo Amber  MD, [place] ([id number]) on 05/31/2014  3:41:25 PM      MDM   Final diagnoses:  Benzodiazepine (tranquilizer) overdose, intentional self-harm, initial encounter    80F here with benzo overdose. States overdosed 2 days ago also. Patient here less than 1 hour from her overdose today. Told me 7 11m Xanax and 7 578mValium. Vitals ok. IVC done by me. Patient given charcoal. I spoke with Poison Control - agree with charcoal, 6 hrs of observation, then can be clear.  Care transferred to Dr. HaAline Brochure   BlEvelina BucyMD 05/31/14 1542  BlEvelina BucyMD 05/31/14 [phone]

## 2014-05-31 NOTE — ED Notes (Signed)
Pt's belongings (clothes, purse, jewelry) given to pt's husband to take home.

## 2014-05-31 NOTE — ED Notes (Signed)
Pt coming to ed with c/o drug overdose, sts took 10-12 xanax and same amount of 50m valium on her way to hospital. Pt sts wants "to end of everything". She overd[facility] on same medications 3 days ago [facility]

## 2014-06-01 NOTE — BH Assessment (Signed)
Assessment completed. Consulted with Serena Colonel, NP who recommended inpatient treatment. [facility] at capacity. TTS will contact other facilities for placement. Dr. Sabra Heck has been notified of the recommendation.

## 2014-06-01 NOTE — BH Assessment (Addendum)
Per Kennyth Lose at [facility] Pt has been accepted to [facility] to the services of Dr. Enzo Bi. Report can be called to [phone]. Pt will be going to the [employer].

## 2014-06-01 NOTE — ED Notes (Signed)
[facility] called for transport request to [facility].

## 2014-06-01 NOTE — ED Provider Notes (Addendum)
Pt meets inpt criteria - will need referral out. Per [facility]  Accepted at [facility] - may leave at [place], MD 06/01/14 [id number]  Johnna Acosta, MD 06/01/14 [phone]

## 2014-06-01 NOTE — ED Notes (Signed)
All paper work faxed to [employer]
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
## 2018-06-28 ENCOUNTER — Encounter: Payer: Self-pay | Admitting: *Deleted

## 2018-06-29 ENCOUNTER — Ambulatory Visit: Payer: Medicaid - Out of State | Admitting: Neurology

## 2018-06-29 ENCOUNTER — Encounter

## 2018-06-29 ENCOUNTER — Telehealth: Payer: Self-pay | Admitting: *Deleted

## 2018-06-29 NOTE — Telephone Encounter (Signed)
No showed new patient appointment. 

## 2018-06-30 ENCOUNTER — Encounter: Payer: Self-pay | Admitting: Neurology

## 2018-08-22 ENCOUNTER — Encounter: Payer: Self-pay | Admitting: Neurology

## 2018-08-22 ENCOUNTER — Telehealth: Payer: Self-pay | Admitting: Neurology

## 2018-08-22 ENCOUNTER — Ambulatory Visit (INDEPENDENT_AMBULATORY_CARE_PROVIDER_SITE_OTHER): Payer: 59 | Admitting: Neurology

## 2018-08-22 ENCOUNTER — Encounter: Payer: Self-pay | Admitting: *Deleted

## 2018-08-22 VITALS — BP 118/66 | HR 70 | Ht 60.0 in | Wt 200.2 lb

## 2018-08-22 DIAGNOSIS — G43709 Chronic migraine without aura, not intractable, without status migrainosus: Secondary | ICD-10-CM

## 2018-08-22 DIAGNOSIS — IMO0002 Reserved for concepts with insufficient information to code with codable children: Secondary | ICD-10-CM

## 2018-08-22 MED ORDER — NORTRIPTYLINE HCL 10 MG PO CAPS: 20.0000 mg | ORAL_CAPSULE | Freq: Every day | ORAL | 11 refills | Status: DC

## 2018-08-22 MED ORDER — ONDANSETRON HCL 4 MG PO TABS: 4.0000 mg | ORAL_TABLET | Freq: Three times a day (TID) | ORAL | 6 refills | Status: AC | PRN
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
## 2018-08-22 NOTE — Telephone Encounter (Signed)
4 wk botox inj // Krista Blue

## 2018-09-06 NOTE — Telephone Encounter (Signed)
Patient has been scheduled. DW

## 2018-09-13 ENCOUNTER — Other Ambulatory Visit: Payer: Self-pay | Admitting: Neurology

## 2018-09-27 MED ORDER — ONABOTULINUMTOXINA 100 UNITS IJ SOLR: 200.0000 [IU] | INTRAMUSCULAR | 3 refills | Status: DC

## 2018-09-27 NOTE — Addendum Note (Signed)
Addended by: Noberto Retort C on: 09/27/2018 03:39 PM   Modules accepted: Orders

## 2018-09-27 NOTE — Telephone Encounter (Signed)
Sharyn Lull, would you send a script for this patients Botox to [facility] In [place]?

## 2018-09-27 NOTE — Telephone Encounter (Signed)
Botox sent to requested pharmacy.

## 2018-10-02 NOTE — Telephone Encounter (Signed)
I called to check status of the medication, it is still pending. DW

## 2018-10-03 NOTE — Telephone Encounter (Signed)
[id number] (10/05/19)

## 2018-10-04 NOTE — Telephone Encounter (Signed)
Pending patient consent.

## 2018-10-05 ENCOUNTER — Ambulatory Visit (INDEPENDENT_AMBULATORY_CARE_PROVIDER_SITE_OTHER): Payer: 59 | Admitting: Neurology

## 2018-10-05 VITALS — BP 161/85 | HR 94 | Ht 60.0 in | Wt 205.0 lb

## 2018-10-05 DIAGNOSIS — G43709 Chronic migraine without aura, not intractable, without status migrainosus: Secondary | ICD-10-CM

## 2018-10-05 DIAGNOSIS — IMO0002 Reserved for concepts with insufficient information to code with codable children: Secondary | ICD-10-CM

## 2018-10-05 NOTE — Progress Notes (Signed)
PATIENT: Dana Leonard DOB: 1971-09-24  Chief Complaint  Patient presents with  . Follow-up    RM EMG 4. Last seen 08/22/18 for OV. This is her first injection of botox for migraines.      HISTORICAL  Dana Leonard is a 47 year old female, seen in request by previous neurology PA Teryl Lucy, Amy for evaluation of chronic headaches, initial evaluation was on August 22, 2018.  I have reviewed and summarized the referring note from the referring physician.  She had a past medical history of bipolar disorder, chronic pain syndrome, insomnia, irritable bowel syndrome, migraine headaches, listed allergy to multiple medications,  she is on polypharmacy treatment, Seroquel 400 mg daily, Ambien, Requip 5 mg at bedtime, Valium, Wellbutrin 150 mg, Flexeril, Xanax, tizanidine, Zofran, also use migrinal  nasal spray as needed  She had a history of migraine headaches since 21, she reported this is following her epidural for her delivery, she also had a seizure then, since then, she began to have frequent headaches, bilateral frontotemporal region severe pounding headache it can goes on for days, may be relentlessly for few months, she was under the care of [place] neurologist  Dr. Elwin Sleight, who has left his practice,  Spring 2019, she reported increased frequency of her headaches, go to local emergency room 4-5 times each month for the treatment of headache, cocktail usually helps her headache, she is on polypharmacy treatment, reported previous amitriptyline has helped her headaches, at the end of today's visit, she also reported recent hospital admission, at [facility], she was found down at home by her son, but I do not have record,  She is not having headaches 4-5 times each week, severe, pounding, reported allergic reaction to Imitrex, Zomig, now taking Migrinal as needed, sometimes daily use in 7 consecutive days.   I reviewed previous neurology note, apparently she has tried Elavil,  Fioricet, gabapentin, Belbuca in the past.  UPDATE Oct 05 2018: She complains of frequent headaches, which can last for 2 weeks,  REVIEW OF SYSTEMS: Full 14 system review of systems performed and notable only for as above  all other review of systems were negative.  ALLERGIES: Allergies  Allergen Reactions  . Latex Rash  . Methadone     Throat swells  . Percocet [Oxycodone-Acetaminophen]     Throat swelling  . Codeine     Throat swelling    . Morphine     REACTION: Tongue swelling  . Sumatriptan     REACTION: Tachycardia    HOME MEDICATIONS: Current Outpatient Medications  Medication Sig Dispense Refill  . ALPRAZolam (XANAX) 1 MG tablet Take 1 mg by mouth at bedtime as needed for anxiety or sleep.     . botulinum toxin Type A (BOTOX) 100 units SOLR injection Inject 200 Units into the muscle every 3 (three) months. 200 Units 3  . buPROPion (WELLBUTRIN XL) 150 MG 24 hr tablet Take 150 mg by mouth every morning.    . cyclobenzaprine (FLEXERIL) 10 MG tablet Take 10 mg by mouth at bedtime.    . diazepam (VALIUM) 5 MG tablet Take 5 mg by mouth every 6 (six) hours as needed for anxiety.    . dihydroergotamine (MIGRANAL) 4 MG/ML nasal spray Place 1 spray into the nose as needed for migraine. Use in one nostril as directed.  No more than 4 sprays in one hour    . DULoxetine (CYMBALTA) 60 MG capsule Take 60 mg by mouth daily.    . furosemide (LASIX)  20 MG tablet Take 20 mg by mouth 2 (two) times a week.     . meloxicam (MOBIC) 15 MG tablet Take 15 mg by mouth daily.    . nortriptyline (PAMELOR) 10 MG capsule TAKE 2 CAPSULES (20 MG TOTAL) BY MOUTH AT BEDTIME. 180 capsule 3  . ondansetron (ZOFRAN) 4 MG tablet Take 1 tablet (4 mg total) by mouth every 8 (eight) hours as needed for nausea or vomiting. Do not refill in less than 30 days 20 tablet 6  . QUEtiapine (SEROQUEL) 400 MG tablet Take 400 mg by mouth at bedtime.    . ropinirole (REQUIP) 5 MG tablet Take 5 mg by mouth at bedtime.  0    . tiZANidine (ZANAFLEX) 4 MG tablet Take 4 mg by mouth 2 (two) times daily.     Marland Kitchen zolpidem (AMBIEN) 10 MG tablet Take 20 mg by mouth at bedtime as needed for sleep.     No current facility-administered medications for this visit.     PAST MEDICAL HISTORY: Past Medical History:  Diagnosis Date  . Bipolar 1 disorder ([place])   . Chronic pain syndrome   . Colon polyp   . Crohn disease ([place])   . CVA (cerebral infarction)    2011/2006 Dr.  Bland Span ([place])  . Depression   . Esophagitis, erosive 02/18/11   (mild) on EGD Dr Gala Romney  . Fibromyalgia   . H. pylori infection   . Hemorrhoids   . HTN (hypertension)   . Hypokalemia    chronic  . Hypopotassemia   . IBS (irritable bowel syndrome) 02/18/11   normal ileocolonsocpy w/ Dr Gala Romney  w/ benign SB bx  . Insomnia   . Migraines   . Mini stroke ([place])   . Osteoporosis   . Ovarian cancer ([place])   . PTSD (post-traumatic stress disorder)   . Rheumatoid arthritis ([place])   . S/P colonoscopy 01/19/2011   Dr. Earley Brooke [facility])  . Thalassemia trait   . Ulcerative colitis ([place])     PAST SURGICAL HISTORY: Past Surgical History:  Procedure Laterality Date  . ABDOMINAL HYSTERECTOMY  2002  . APPENDECTOMY  1997  . REPLACEMENT TOTAL KNEE Left     FAMILY HISTORY: Family History  Problem Relation Age of Onset  . Sickle cell anemia Mother   . Diabetes Mother   . Coronary artery disease Mother   . Migraines Mother   . Hypertension Mother   . Diabetes Father   . Coronary artery disease Father   . Seizures Sister   . Coronary artery disease Sister   . Kidney failure Son     SOCIAL HISTORY: Social History   Socioeconomic History  . Marital status: Married    Spouse name: Not on file  . Number of children: 2  . Years of education: college  . Highest education level: Doctorate  Occupational History  . 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 Financial resource strain: Not on file  . Food insecurity:    Worry: Not on file    Inability: Not on file  . Transportation needs:    Medical: Not on file    Non-medical: Not on file  Tobacco Use  . Smoking status: Never Smoker  . Smokeless tobacco: Never Used  Substance and Sexual Activity  . Alcohol use: Not Currently  . Drug use: No  . Sexual activity: Yes  Partners: Male    Birth control/protection: None    Comment: spouse  Lifestyle  . Physical activity:    Days per week: Not on file    Minutes per session: Not on file  . Stress: Not on file  Relationships  . Social connections:    Talks on phone: Not on file    Gets together: Not on file    Attends religious service: Not on file    Active member of club or organization: Not on file    Attends meetings of clubs or organizations: Not on file    Relationship status: Not on file  . Intimate partner violence:    Fear of current or ex partner: Not on file    Emotionally abused: Not on file    Physically abused: Not on file    Forced sexual activity: Not on file  Other Topics Concern  . Not on file  Social History Narrative   Lives at home with her husband and two children.   Left-handed.   No caffeine use.     PHYSICAL EXAM   Vitals:   10/05/18 1023  BP: (!) 161/85  Pulse: 94  SpO2: 96%  Weight: 205 lb (93 kg)  Height: 5' (1.524 m)    Not recorded      Body mass index is 40.04 kg/m.  PHYSICAL EXAMNIATION:  Gen: NAD, conversant, well nourised, obese, well groomed                     Cardiovascular: Regular rate rhythm, no peripheral edema, warm, nontender. Eyes: Conjunctivae clear without exudates or hemorrhage Neck: Supple, no carotid bruits. Pulmonary: Clear to auscultation bilaterally   NEUROLOGICAL EXAM:  MENTAL STATUS: Speech:    Speech is normal; fluent and spontaneous with normal comprehension.  Cognition:     Orientation to time, place and person     Normal  recent and remote memory     Normal Attention span and concentration     Normal Language, naming, repeating,spontaneous speech     Fund of knowledge   CRANIAL NERVES: CN II: Visual fields are full to confrontation. Pupils are round equal and briskly reactive to light. CN III, IV, VI: extraocular movement are normal. No ptosis. CN V: Facial sensation is intact to pinprick in all 3 divisions bilaterally. Corneal responses are intact.  CN VII: Face is symmetric with normal eye closure and smile. CN VIII: Hearing is normal to rubbing fingers CN IX, X: Palate elevates symmetrically. Phonation is normal. CN XI: Head turning and shoulder shrug are intact CN XII: Tongue is midline with normal movements and no atrophy.  MOTOR: There is no pronator drift of out-stretched arms. Muscle bulk and tone are normal. Muscle strength is normal.  REFLEXES: Reflexes are 2+ and symmetric at the biceps, triceps, knees, and ankles. Plantar responses are flexor.  SENSORY: Intact to light touch, pinprick, positional sensation and vibratory sensation are intact in fingers and toes.  COORDINATION: Rapid alternating movements and fine finger movements are intact. There is no dysmetria on finger-to-nose and heel-knee-shin.    GAIT/STANCE: Antalgic due to right knee, ankle pain Romberg is absent.   DIAGNOSTIC DATA (LABS, IMAGING, TESTING) - I reviewed patient records, labs, notes, testing and imaging myself where available.   ASSESSMENT AND PLAN  Dana Leonard is a 47 y.o. female   Chronic migraine Mood disorder Polypharmacy treatment   nortriptyline, 10 mg titrating to 20 mg every night as preventive medication  Limit migrainal  use to 2 times each week  This is her first Botox injection as migraine prevention, on Oct 05 2018.  Botox injection for chronic migraine prevention, injection was performed according to [place] protocol,  5 units of Botox was injected into each side, for 31 injection  sites, total of 155 units  Bilateral frontalis 4 injection sites Bilateral corrugate 2 injection sites Procerus 1 injection sites. Bilateral temporalis 8 injection sites Bilateral occipitalis 6 injection sites Bilateral cervical paraspinals 4 injection sites Bilateral upper trapezius 6 injection sites  Extra 45 unites were injected into bilateral upper cervical regions.     Marcial Pacas, M.D. Ph.D.  [facility] [address] [place], [place] [address] Ph: [phone] Fax: [phone]  CC: Teodora Medici, PA-C

## 2018-10-05 NOTE — Progress Notes (Signed)
**  Botox 100 units x 2 vials, NDC 4383-8184-03, Lot [id number], Exp 02/2021, specialty pharmacy.//mck,rn**

## 2018-12-25 ENCOUNTER — Telehealth: Payer: Self-pay | Admitting: Neurology

## 2018-12-25 MED ORDER — ONABOTULINUMTOXINA 100 UNITS IJ SOLR: 200.0000 [IU] | INTRAMUSCULAR | 3 refills | Status: DC

## 2018-12-25 NOTE — Telephone Encounter (Signed)
Botox prescription has been sent to the requested pharmacy.

## 2018-12-25 NOTE — Telephone Encounter (Signed)
Sharyn Lull, would you send a script over to [facility] for this patients Botox?

## 2018-12-26 NOTE — Telephone Encounter (Signed)
Noted, thank you

## 2018-12-28 ENCOUNTER — Telehealth: Payer: Self-pay | Admitting: Neurology

## 2018-12-28 NOTE — Telephone Encounter (Signed)
Returned the call to the patient.  She has only had one treatment with Botox.  Since her treatment, she has has been under increased stress.  She has a pending right knee replacement at the end of March and her son is awaiting a kidney transplant.  She feels this stress is playing a large role on her migraines.  She is going to keep her pending Botox appt on 01/03/2019.  She will call to [facility] today to provide consent for shipment.

## 2018-12-28 NOTE — Telephone Encounter (Signed)
Pt has called to inform the Botox did not help her at all.  Pt is asking for a call to discuss other treatment options

## 2019-01-01 NOTE — Telephone Encounter (Signed)
I called [facility] [phone] to schedule delivery. They are pending patient consent. They have tried calling her twice but she did not answer. I called the patient and made her aware she needs to call them today. She took the number down and said she was going to take care of it. DW

## 2019-01-03 ENCOUNTER — Ambulatory Visit: Payer: Self-pay | Admitting: Neurology

## 2019-01-03 ENCOUNTER — Telehealth: Payer: Self-pay | Admitting: *Deleted

## 2019-01-03 NOTE — Telephone Encounter (Signed)
Pt said she is sick, has a fever, achey and nauseated. Pt is having knee replacement on 3/24. If appt cannot be r/s prior to that she said May would be ok. Please call to r/s. Thank you

## 2019-01-03 NOTE — Telephone Encounter (Signed)
No showed Botox appointment.

## 2019-01-04 ENCOUNTER — Encounter: Payer: Self-pay | Admitting: Neurology

## 2019-01-04 NOTE — Telephone Encounter (Signed)
I dont see anything sooner, is May ok Sharyn Lull?

## 2019-01-04 NOTE — Telephone Encounter (Signed)
Emily/DanaSharyn Lull and Dr. Krista Blue are off until next week. I do not see anything before 01/16/19. You can schedule her for next available and place her on cx list. She can always call back to see if any cancellations over time as well to get worked back in.

## 2019-01-08 NOTE — Telephone Encounter (Signed)
I will call patient to get her resh.

## 2019-01-09 NOTE — Telephone Encounter (Signed)
Patient has been rescheduled until May 27 th for her Botox injection with Dr. Krista Blue.

## 2019-03-06 ENCOUNTER — Telehealth: Payer: Self-pay | Admitting: Neurology

## 2019-03-06 NOTE — Telephone Encounter (Signed)
Pt states she has been suffering a headache for 46 days and would like to know if she can move her BOTOX appt for something sooner. Please advise.

## 2019-03-07 NOTE — Telephone Encounter (Signed)
Noted, thank you. DW

## 2019-03-07 NOTE — Telephone Encounter (Signed)
I returned the call to the patient and left her a detailed message.  Her Botox appt has been moved to 03/14/2019 at 9am.  She should arrive at 8:30am for check-in.  Our new system for in-office appts has been explained in detail.  Provided our number to call back with any questions.

## 2019-03-14 ENCOUNTER — Ambulatory Visit (INDEPENDENT_AMBULATORY_CARE_PROVIDER_SITE_OTHER): Payer: 59 | Admitting: Neurology

## 2019-03-14 ENCOUNTER — Encounter: Payer: Self-pay | Admitting: Neurology

## 2019-03-14 ENCOUNTER — Other Ambulatory Visit: Payer: Self-pay

## 2019-03-14 VITALS — BP 119/80 | HR 97 | Temp 98.6°F | Ht 60.0 in | Wt 202.0 lb

## 2019-03-14 DIAGNOSIS — G43709 Chronic migraine without aura, not intractable, without status migrainosus: Secondary | ICD-10-CM | POA: Diagnosis not present

## 2019-03-14 DIAGNOSIS — IMO0002 Reserved for concepts with insufficient information to code with codable children: Secondary | ICD-10-CM

## 2019-03-14 NOTE — Progress Notes (Signed)
PATIENT: Dana Leonard DOB: [birth year]  Chief Complaint  Patient presents with  . Migraine    Botox 100 units x 2 vials - specialty pharmacy     HISTORICAL  Dana Leonard is a 48 year old female, seen in request by previous neurology PA Teryl Lucy, Amy for evaluation of chronic headaches, initial evaluation was on August 22, 2018.  I have reviewed and summarized the referring note from the referring physician.  She had a past medical history of bipolar disorder, chronic pain syndrome, insomnia, irritable bowel syndrome, migraine headaches, listed allergy to multiple medications,  she is on polypharmacy treatment, Seroquel 400 mg daily, Ambien, Requip 5 mg at bedtime, Valium, Wellbutrin 150 mg, Flexeril, Xanax, tizanidine, Zofran, also use migrinal  nasal spray as needed  She had a history of migraine headaches since 21, she reported this is following her epidural for her delivery, she also had a seizure then, since then, she began to have frequent headaches, bilateral frontotemporal region severe pounding headache it can goes on for days, may be relentlessly for few months, she was under the care of [place] neurologist  Dr. Elwin Sleight, who has left his practice,  Spring 2019, she reported increased frequency of her headaches, go to local emergency room 4-5 times each month for the treatment of headache, cocktail usually helps her headache, she is on polypharmacy treatment, reported previous amitriptyline has helped her headaches, at the end of today's visit, she also reported recent hospital admission, at [facility], she was found down at home by her son, but I do not have record,  She is not having headaches 4-5 times each week, severe, pounding, reported allergic reaction to Imitrex, Zomig, now taking Migrinal as needed, sometimes daily use in 7 consecutive days.   I reviewed previous neurology note, apparently she has tried Elavil, Fioricet, gabapentin, Belbuca in the past.   UPDATE Oct 05 2018: She complains of frequent headaches, which can last for 2 weeks,  UPDATE Mar 14 2019: She came in wheelchair, complains of daily headaches   REVIEW OF SYSTEMS: Full 14 system review of systems performed and notable only for as above  all other review of systems were negative.  ALLERGIES: Allergies  Allergen Reactions  . Latex Rash  . Methadone     Throat swells  . Percocet [Oxycodone-Acetaminophen]     Throat swelling  . Codeine     Throat swelling    . Morphine     REACTION: Tongue swelling  . Sumatriptan     REACTION: Tachycardia    HOME MEDICATIONS: Current Outpatient Medications  Medication Sig Dispense Refill  . ALPRAZolam (XANAX) 1 MG tablet Take 1 mg by mouth at bedtime as needed for anxiety or sleep.     . botulinum toxin Type A (BOTOX) 100 units SOLR injection Inject 200 Units into the muscle every 3 (three) months. 200 Units 3  . buPROPion (WELLBUTRIN XL) 150 MG 24 hr tablet Take 150 mg by mouth every morning.    . cyclobenzaprine (FLEXERIL) 10 MG tablet Take 10 mg by mouth at bedtime.    . diazepam (VALIUM) 5 MG tablet Take 5 mg by mouth every 6 (six) hours as needed for anxiety.    . dihydroergotamine (MIGRANAL) 4 MG/ML nasal spray Place 1 spray into the nose as needed for migraine. Use in one nostril as directed.  No more than 4 sprays in one hour    . DULoxetine (CYMBALTA) 60 MG capsule Take 60 mg by mouth daily.    Marland Kitchen  furosemide (LASIX) 20 MG tablet Take 20 mg by mouth 2 (two) times a week.     . meloxicam (MOBIC) 15 MG tablet Take 15 mg by mouth daily.    . nortriptyline (PAMELOR) 10 MG capsule TAKE 2 CAPSULES (20 MG TOTAL) BY MOUTH AT BEDTIME. 180 capsule 3  . ondansetron (ZOFRAN) 4 MG tablet Take 1 tablet (4 mg total) by mouth every 8 (eight) hours as needed for nausea or vomiting. Do not refill in less than 30 days 20 tablet 6  . QUEtiapine (SEROQUEL) 400 MG tablet Take 400 mg by mouth at bedtime.    . ropinirole (REQUIP) 5 MG tablet  Take 5 mg by mouth at bedtime.  0  . tiZANidine (ZANAFLEX) 4 MG tablet Take 4 mg by mouth 2 (two) times daily.     Marland Kitchen zolpidem (AMBIEN) 10 MG tablet Take 20 mg by mouth at bedtime as needed for sleep.     No current facility-administered medications for this visit.     PAST MEDICAL HISTORY: Past Medical History:  Diagnosis Date  . Bipolar 1 disorder ([place])   . Chronic pain syndrome   . Colon polyp   . Crohn disease ([place])   . CVA (cerebral infarction)    2011/2006 Dr.  Bland Span ([place])  . Depression   . Esophagitis, erosive 02/18/11   (mild) on EGD Dr Gala Romney  . Fibromyalgia   . H. pylori infection   . Hemorrhoids   . HTN (hypertension)   . Hypokalemia    chronic  . Hypopotassemia   . IBS (irritable bowel syndrome) 02/18/11   normal ileocolonsocpy w/ Dr Gala Romney  w/ benign SB bx  . Insomnia   . Migraines   . Mini stroke ([place])   . Osteoporosis   . Ovarian cancer ([place])   . PTSD (post-traumatic stress disorder)   . Rheumatoid arthritis ([place])   . S/P colonoscopy 01/19/2011   Dr. Earley Brooke [facility])  . Thalassemia trait   . Ulcerative colitis ([place])     PAST SURGICAL HISTORY: Past Surgical History:  Procedure Laterality Date  . ABDOMINAL HYSTERECTOMY  2002  . APPENDECTOMY  1997  . REPLACEMENT TOTAL KNEE Left     FAMILY HISTORY: Family History  Problem Relation Age of Onset  . Sickle cell anemia Mother   . Diabetes Mother   . Coronary artery disease Mother   . Migraines Mother   . Hypertension Mother   . Diabetes Father   . Coronary artery disease Father   . Seizures Sister   . Coronary artery disease Sister   . Kidney failure Son     SOCIAL HISTORY: Social History   Socioeconomic History  . Marital status: Married    Spouse name: Not on file  . Number of children: 2  . Years of education: college  . Highest education level: Doctorate  Occupational History  . 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 Financial resource strain: Not on file  . Food insecurity:    Worry: Not on file    Inability: Not on file  . Transportation needs:    Medical: Not on file    Non-medical: Not on file  Tobacco Use  . Smoking status: Never Smoker  . Smokeless tobacco: Never Used  Substance and Sexual Activity  . Alcohol use: Not Currently  . Drug use: No  . Sexual activity: Yes  Partners: Male    Birth control/protection: None    Comment: spouse  Lifestyle  . Physical activity:    Days per week: Not on file    Minutes per session: Not on file  . Stress: Not on file  Relationships  . Social connections:    Talks on phone: Not on file    Gets together: Not on file    Attends religious service: Not on file    Active member of club or organization: Not on file    Attends meetings of clubs or organizations: Not on file    Relationship status: Not on file  . Intimate partner violence:    Fear of current or ex partner: Not on file    Emotionally abused: Not on file    Physically abused: Not on file    Forced sexual activity: Not on file  Other Topics Concern  . Not on file  Social History Narrative   Lives at home with her husband and two children.   Left-handed.   No caffeine use.     PHYSICAL EXAM   Vitals:   03/14/19 0852  BP: 119/80  Pulse: 97  Temp: 98.6 F (37 C)  Weight: 202 lb (91.6 kg)  Height: 5' (1.524 m)    Not recorded       ASSESSMENT AND PLAN  Latessa Tillis is a 48 y.o. female   Chronic migraine Mood disorder Polypharmacy treatment   nortriptyline, 10 mg titrating to 20 mg every night as preventive medication  Limit migrainal use to 2 times each week  Her first Botox injection as migraine prevention, on Oct 05 2018.  Botox injection for chronic migraine prevention, injection was performed according to [place] protocol,  5 units of Botox was injected into each side, for 31 injection sites, total of 155  units  Bilateral frontalis 4 injection sites Bilateral corrugate 2 injection sites Procerus 1 injection sites. Bilateral temporalis 8 injection sites Bilateral occipitalis 6 injection sites Bilateral cervical paraspinals 4 injection sites Bilateral upper trapezius 6 injection sites  Extra 45 unites were injected into bilateral upper cervical regions.   Marcial Pacas, M.D. Ph.D.  [facility] [address], [place], [place] [address] Ph: [phone] Fax: [phone]  CC: Teodora Medici, PA-C

## 2019-03-14 NOTE — Progress Notes (Signed)
**  Botox 100 units x 2 vials, NDC 1969-4098-28, Lot [id number], Exp 06/2021, specialty pharmacy.//mck,rn**

## 2019-03-21 ENCOUNTER — Ambulatory Visit: Payer: PRIVATE HEALTH INSURANCE | Admitting: Neurology

## 2019-04-11 ENCOUNTER — Ambulatory Visit: Payer: PRIVATE HEALTH INSURANCE | Admitting: Neurology

## 2019-06-20 ENCOUNTER — Encounter: Payer: Self-pay | Admitting: Neurology

## 2019-06-20 ENCOUNTER — Other Ambulatory Visit: Payer: Self-pay

## 2019-06-20 ENCOUNTER — Ambulatory Visit (INDEPENDENT_AMBULATORY_CARE_PROVIDER_SITE_OTHER): Payer: 59 | Admitting: Neurology

## 2019-06-20 VITALS — BP 153/89 | HR 91 | Temp 97.7°F | Ht 60.0 in | Wt 225.5 lb

## 2019-06-20 DIAGNOSIS — G43709 Chronic migraine without aura, not intractable, without status migrainosus: Secondary | ICD-10-CM | POA: Diagnosis not present

## 2019-06-20 DIAGNOSIS — IMO0002 Reserved for concepts with insufficient information to code with codable children: Secondary | ICD-10-CM

## 2019-06-20 MED ORDER — RIZATRIPTAN BENZOATE 10 MG PO TBDP: 10.0000 mg | ORAL_TABLET | ORAL | 6 refills | Status: DC | PRN

## 2019-06-20 NOTE — Progress Notes (Signed)
**  Botox 100 units x 2 vials, NDC 9747-1855-01, Lot [id number], Exp 11/2021, specialty pharmacy.//mck,rn**

## 2019-06-20 NOTE — Progress Notes (Signed)
PATIENT: Dana Leonard DOB: 05-07-1971  Chief Complaint  Patient presents with  . Migraine    Botox 100 units x 2 vials - specialty pharmacy     HISTORICAL  Dana Leonard is a 48 year old female, seen in request by previous neurology PA Teryl Lucy, Amy for evaluation of chronic headaches, initial evaluation was on August 22, 2018.  I have reviewed and summarized the referring note from the referring physician.  She had a past medical history of bipolar disorder, chronic pain syndrome, insomnia, irritable bowel syndrome, migraine headaches, listed allergy to multiple medications,  she is on polypharmacy treatment, Seroquel 400 mg daily, Ambien, Requip 5 mg at bedtime, Valium, Wellbutrin 150 mg, Flexeril, Xanax, tizanidine, Zofran, also use migrinal  nasal spray as needed  She had a history of migraine headaches since 21, she reported this is following her epidural for her delivery, she also had a seizure then, since then, she began to have frequent headaches, bilateral frontotemporal region severe pounding headache it can goes on for days, may be relentlessly for few months, she was under the care of [facility]  Dr. Elwin Sleight, who has left his practice,  Spring 2019, she reported increased frequency of her headaches, go to local emergency room 4-5 times each month for the treatment of headache, cocktail usually helps her headache, she is on polypharmacy treatment, reported previous amitriptyline has helped her headaches, at the end of today's visit, she also reported recent hospital admission, at [facility], she was found down at home by her son, but I do not have record,  She is not having headaches 4-5 times each week, severe, pounding, reported allergic reaction to Imitrex, Zomig, now taking Migrinal as needed, sometimes daily use in 7 consecutive days.   I reviewed previous neurology note, apparently she has tried Elavil, Fioricet, gabapentin, Belbuca in the past.   UPDATE Oct 05 2018: She complains of frequent headaches, which can last for 2 weeks,  UPDATE Mar 14 2019: She came in wheelchair, complains of daily headaches  Update June 20, 2019:  She reported 85% improvement in her headaches, complains of occasionally severe headaches, failed home remedy, previously tried Imitrex complains of tachycardia, she is on polypharmacy treatment, including Seroquel up to 400 mg daily, Cymbalta 60 mg daily, Wellbutrin, Ambien, she continue complains of depression, difficulty sleeping   REVIEW OF SYSTEMS: Full 14 system review of systems performed and notable only for as above  all other review of systems were negative.  ALLERGIES: Allergies  Allergen Reactions  . Latex Rash  . Methadone     Throat swells  . Percocet [Oxycodone-Acetaminophen]     Throat swelling  . Codeine     Throat swelling    . Morphine     REACTION: Tongue swelling  . Sumatriptan     REACTION: Tachycardia    HOME MEDICATIONS: Current Outpatient Medications  Medication Sig Dispense Refill  . ALPRAZolam (XANAX) 1 MG tablet Take 1 mg by mouth at bedtime as needed for anxiety or sleep.     . botulinum toxin Type A (BOTOX) 100 units SOLR injection Inject 200 Units into the muscle every 3 (three) months. 200 Units 3  . buPROPion (WELLBUTRIN XL) 150 MG 24 hr tablet Take 150 mg by mouth every morning.    . cyclobenzaprine (FLEXERIL) 10 MG tablet Take 10 mg by mouth at bedtime.    . diazepam (VALIUM) 5 MG tablet Take 5 mg by mouth every 6 (six) hours as needed for  anxiety.    . dihydroergotamine (MIGRANAL) 4 MG/ML nasal spray Place 1 spray into the nose as needed for migraine. Use in one nostril as directed.  No more than 4 sprays in one hour    . DULoxetine (CYMBALTA) 60 MG capsule Take 60 mg by mouth daily.    . furosemide (LASIX) 20 MG tablet Take 20 mg by mouth 2 (two) times a week.     . meloxicam (MOBIC) 15 MG tablet Take 15 mg by mouth daily.    . nortriptyline (PAMELOR) 10  MG capsule TAKE 2 CAPSULES (20 MG TOTAL) BY MOUTH AT BEDTIME. 180 capsule 3  . ondansetron (ZOFRAN) 4 MG tablet Take 1 tablet (4 mg total) by mouth every 8 (eight) hours as needed for nausea or vomiting. Do not refill in less than 30 days 20 tablet 6  . QUEtiapine (SEROQUEL) 400 MG tablet Take 400 mg by mouth at bedtime.    . ropinirole (REQUIP) 5 MG tablet Take 5 mg by mouth at bedtime.  0  . tiZANidine (ZANAFLEX) 4 MG tablet Take 4 mg by mouth 2 (two) times daily.     Marland Kitchen zolpidem (AMBIEN) 10 MG tablet Take 20 mg by mouth at bedtime as needed for sleep.     No current facility-administered medications for this visit.     PAST MEDICAL HISTORY: Past Medical History:  Diagnosis Date  . Bipolar 1 disorder ([place])   . Chronic pain syndrome   . Colon polyp   . Crohn disease ([place])   . CVA (cerebral infarction)    2011/2006 Dr.  [place]an [place]  . Depression   . Esophagitis, erosive 02/18/11   (mild) on EGD Dr Gala Romney  . Fibromyalgia   . H. pylori infection   . Hemorrhoids   . HTN (hypertension)   . Hypokalemia    chronic  . Hypopotassemia   . IBS (irritable bowel syndrome) 02/18/11   normal ileocolonsocpy w/ Dr Gala Romney  w/ benign SB bx  . Insomnia   . Migraines   . Mini stroke ([place])   . Osteoporosis   . Ovarian cancer ([place])   . PTSD (post-traumatic stress disorder)   . Rheumatoid arthritis ([place])   . S/P colonoscopy 01/19/2011   Dr. Earley [facility]oke [facility])  . Thalassemia trait   . Ulcerative colitis ([place])     PAST SURGICAL HISTORY: Past Surgical History:  Procedure Laterality Date  . ABDOMINAL HYSTERECTOMY  2002  . APPENDECTOMY  1997  . REPLACEMENT TOTAL KNEE Left     FAMILY HISTORY: Family History  Problem Relation Age of Onset  . Sickle cell anemia Mother   . Diabetes Mother   . Coronary artery disease Mother   . Migraines Mother   . Hypertension Mother   . Diabetes Father   . Coronary artery disease Father   . Seizures Sister   . Coronary artery disease Sister    . Kidney failure Son     SOCIAL HISTORY: Social History   Socioeconomic History  . Marital status: Married    Spouse name: Not on file  . Number of children: 2  . Years of education: college  . Highest education level: Doctorate  Occupational History  . 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 Financial resource strain: Not on file  . Food insecurity    Worry: Not on file    Inability: Not on file  .  Transportation needs    Medical: Not on file    Non-medical: Not on file  Tobacco Use  . Smoking status: Never Smoker  . Smokeless tobacco: Never Used  Substance and Sexual Activity  . Alcohol use: Not Currently  . Drug use: No  . Sexual activity: Yes    Partners: Male    Birth control/protection: None    Comment: spouse  Lifestyle  . Physical activity    Days per week: Not on file    Minutes per session: Not on file  . Stress: Not on file  Relationships  . Social Herbalist on phone: Not on file    Gets together: Not on file    Attends religious service: Not on file    Active member of club or organization: Not on file    Attends meetings of clubs or organizations: Not on file    Relationship status: Not on file  . Intimate partner violence    Fear of current or ex partner: Not on file    Emotionally abused: Not on file    Physically abused: Not on file    Forced sexual activity: Not on file  Other Topics Concern  . Not on file  Social History Narrative   Lives at home with her husband and two children.   Left-handed.   No caffeine use.     PHYSICAL EXAM   Vitals:   06/20/19 1320  BP: (!) 153/89  Pulse: 91  Temp: 97.7 F (36.5 C)  Weight: 225 lb 8 oz (102.3 kg)  Height: 5' (1.524 m)    Not recorded       ASSESSMENT AND PLAN  Jaxie Racanelli is a 48 y.o. female   Chronic migraine Mood disorder Polypharmacy treatment    nortriptyline, 10 mg titrating to 20 mg every night as preventive medication  Maxalt as needed  Her first Botox injection as migraine prevention, on Oct 05 2018.  Botox injection for chronic migraine prevention, injection was performed according to [place] protocol,  5 units of Botox was injected into each side, for 31 injection sites, total of 155 units  Bilateral frontalis 4 injection sites Bilateral corrugate 2 injection sites Procerus 1 injection sites. Bilateral temporalis 8 injection sites Bilateral occipitalis 6 injection sites Bilateral cervical paraspinals 4 injection sites Bilateral upper trapezius 6 injection sites  Extra 30 unites were injected into bilateral upper cervical regions.  15 units was injected into right masseters muscle.   Marcial Pacas, M.D. Ph.D.  [facility] [address] [place], [place] [address] Ph: [phone] Fax: [phone]  CC: Teodora Medici, PA-C

## 2019-09-24 ENCOUNTER — Telehealth: Payer: Self-pay | Admitting: Neurology

## 2019-09-24 NOTE — Telephone Encounter (Signed)
I returned the call to the patient.  States she had redness at the injection sites for several days after receiving Botox (last injection 06/20/2019). She is aware this is normal.  She also noticed one eye brow slightly raised more than the other.  However, this has improved.  Reporting increase in migraines.  States her PCP discontinued her nortriptyline.  She has a pending appt on 09/26/2019 and her medication treatment will be discussed at the appt.

## 2019-09-24 NOTE — Telephone Encounter (Signed)
Pt requesting a call stating she has noticed a few different reactions to the botox such as redness on her face and "feeling my face is frozen in areas" please call to advise.

## 2019-09-26 ENCOUNTER — Ambulatory Visit (INDEPENDENT_AMBULATORY_CARE_PROVIDER_SITE_OTHER): Payer: 59 | Admitting: Neurology

## 2019-09-26 ENCOUNTER — Other Ambulatory Visit: Payer: Self-pay

## 2019-09-26 ENCOUNTER — Encounter: Payer: Self-pay | Admitting: Neurology

## 2019-09-26 VITALS — BP 153/90 | HR 99 | Temp 97.1°F | Ht 60.0 in | Wt 236.5 lb

## 2019-09-26 DIAGNOSIS — G43709 Chronic migraine without aura, not intractable, without status migrainosus: Secondary | ICD-10-CM

## 2019-09-26 DIAGNOSIS — IMO0002 Reserved for concepts with insufficient information to code with codable children: Secondary | ICD-10-CM

## 2019-09-26 MED ORDER — AIMOVIG 70 MG/ML ~~LOC~~ SOAJ
70.0000 mg | SUBCUTANEOUS | 11 refills | Status: AC
Start: 2019-09-26 — End: ?

## 2019-09-26 MED ORDER — RIZATRIPTAN BENZOATE 10 MG PO TBDP: 10.0000 mg | ORAL_TABLET | ORAL | 6 refills | Status: AC | PRN

## 2019-09-26 NOTE — Progress Notes (Signed)
**  Botox 100 units x 2 vials, NDC 5329-9242-68, Lot [id number], Exp 05/2022, specialty pharmacy.//mck,rn**

## 2019-09-26 NOTE — Progress Notes (Signed)
PATIENT: Dana Leonard DOB: [birth year]  Chief Complaint  Patient presents with  . Migraine    Botox 100 units x 2 vials - specialty pharmacy     HISTORICAL  Dana Leonard is a 48 year old female, seen in request by previous neurology PA Teryl Lucy, Amy for evaluation of chronic headaches, initial evaluation was on August 22, 2018.  I have reviewed and summarized the referring note from the referring physician.  She had a past medical history of bipolar disorder, chronic pain syndrome, insomnia, irritable bowel syndrome, migraine headaches, listed allergy to multiple medications,  she is on polypharmacy treatment, Seroquel 400 mg daily, Ambien, Requip 5 mg at bedtime, Valium, Wellbutrin 150 mg, Flexeril, Xanax, tizanidine, Zofran, also use migrinal  nasal spray as needed  She had a history of migraine headaches since 21, she reported this is following her epidural for her delivery, she also had a seizure then, since then, she began to have frequent headaches, bilateral frontotemporal region severe pounding headache it can goes on for days, may be relentlessly for few months, she was under the care of [place] neurologist  Dr. Elwin Sleight, who has left his practice,  Spring 2019, she reported increased frequency of her headaches, go to local emergency room 4-5 times each month for the treatment of headache, cocktail usually helps her headache, she is on polypharmacy treatment, reported previous amitriptyline has helped her headaches, at the end of today's visit, she also reported recent hospital admission, at [facility], she was found down at home by her son, but I do not have record,  She is not having headaches 4-5 times each week, severe, pounding, reported allergic reaction to Imitrex, Zomig, now taking Migrinal as needed, sometimes daily use in 7 consecutive days.   I reviewed previous neurology note, apparently she has tried Elavil, Fioricet, gabapentin, Belbuca in the past.   UPDATE Oct 05 2018: She complains of frequent headaches, which can last for 2 weeks,  UPDATE Mar 14 2019: She came in wheelchair, complains of daily headaches  Update June 20, 2019: She reported 85% improvement in her headaches, complains of occasionally severe headaches, failed home remedy, previously tried Imitrex complains of tachycardia, she is on polypharmacy treatment, including Seroquel up to 400 mg daily, Cymbalta 60 mg daily, Wellbutrin, Ambien, she continue complains of depression, difficulty sleeping  UPDATE Sep 26 2019: She complains of frequent headache despite previous Botox treatment, will add on Aimovig 14m every 30 days  REVIEW OF SYSTEMS: Full 14 system review of systems performed and notable only for as above  all other review of systems were negative.  ALLERGIES: Allergies  Allergen Reactions  . Latex Rash  . Methadone     Throat swells  . Percocet [Oxycodone-Acetaminophen]     Throat swelling  . Codeine     Throat swelling    . Morphine     REACTION: Tongue swelling  . Sumatriptan     REACTION: Tachycardia    HOME MEDICATIONS: Current Outpatient Medications  Medication Sig Dispense Refill  . botulinum toxin Type A (BOTOX) 100 units SOLR injection Inject 200 Units into the muscle every 3 (three) months. 200 Units 3  . buPROPion (WELLBUTRIN XL) 150 MG 24 hr tablet Take 150 mg by mouth every morning.    . cyclobenzaprine (FLEXERIL) 10 MG tablet Take 10 mg by mouth at bedtime.    . dihydroergotamine (MIGRANAL) 4 MG/ML nasal spray Place 1 spray into the nose as needed for migraine. Use in one nostril  as directed.  No more than 4 sprays in one hour    . DULoxetine (CYMBALTA) 60 MG capsule Take 60 mg by mouth daily.    . furosemide (LASIX) 20 MG tablet Take 20 mg by mouth 2 (two) times a week.     . meloxicam (MOBIC) 15 MG tablet Take 15 mg by mouth daily.    . ondansetron (ZOFRAN) 4 MG tablet Take 1 tablet (4 mg total) by mouth every 8 (eight) hours as  needed for nausea or vomiting. Do not refill in less than 30 days 20 tablet 6  . QUEtiapine (SEROQUEL) 400 MG tablet Take 400 mg by mouth at bedtime.    . rizatriptan (MAXALT-MLT) 10 MG disintegrating tablet Take 1 tablet (10 mg total) by mouth as needed. May repeat in 2 hours if needed 15 tablet 6  . ropinirole (REQUIP) 5 MG tablet Take 5 mg by mouth at bedtime.  0  . tiZANidine (ZANAFLEX) 4 MG tablet Take 4 mg by mouth 2 (two) times daily.     Marland Kitchen zolpidem (AMBIEN) 10 MG tablet Take 20 mg by mouth at bedtime as needed for sleep.     No current facility-administered medications for this visit.     PAST MEDICAL HISTORY: Past Medical History:  Diagnosis Date  . Bipolar 1 disorder ([place])   . Chronic pain syndrome   . Colon polyp   . Crohn disease ([place])   . CVA (cerebral infarction)    2011/2006 Dr.  Bland Span ([place])  . Depression   . Esophagitis, erosive 02/18/11   (mild) on EGD Dr Gala Romney  . Fibromyalgia   . H. pylori infection   . Hemorrhoids   . HTN (hypertension)   . Hypokalemia    chronic  . Hypopotassemia   . IBS (irritable bowel syndrome) 02/18/11   normal ileocolonsocpy w/ Dr Gala Romney  w/ benign SB bx  . Insomnia   . Migraines   . Mini stroke ([place])   . Osteoporosis   . Ovarian cancer ([place])   . PTSD (post-traumatic stress disorder)   . Rheumatoid arthritis ([place])   . S/P colonoscopy 01/19/2011   Dr. Earley Brooke [facility])  . Thalassemia trait   . Ulcerative colitis ([place])     PAST SURGICAL HISTORY: Past Surgical History:  Procedure Laterality Date  . ABDOMINAL HYSTERECTOMY  2002  . APPENDECTOMY  1997  . REPLACEMENT TOTAL KNEE Left     FAMILY HISTORY: Family History  Problem Relation Age of Onset  . Sickle cell anemia Mother   . Diabetes Mother   . Coronary artery disease Mother   . Migraines Mother   . Hypertension Mother   . Diabetes Father   . Coronary artery disease Father   . Seizures Sister   . Coronary artery disease Sister   . Kidney failure Son      SOCIAL HISTORY: Social History   Socioeconomic History  . Marital status: Married    Spouse name: Not on file  . Number of children: 2  . Years of education: college  . Highest education level: Doctorate  Occupational History  . 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 Financial resource strain: Not on file  . Food insecurity    Worry: Not on file    Inability: Not on file  . Transportation needs    Medical: Not on file    Non-medical: Not on file  Tobacco Use  . Smoking status: Never Smoker  . Smokeless tobacco: Never Used  Substance and Sexual Activity  . Alcohol use: Not Currently  . Drug use: No  . Sexual activity: Yes    Partners: Male    Birth control/protection: None    Comment: spouse  Lifestyle  . Physical activity    Days per week: Not on file    Minutes per session: Not on file  . Stress: Not on file  Relationships  . Social Herbalist on phone: Not on file    Gets together: Not on file    Attends religious service: Not on file    Active member of club or organization: Not on file    Attends meetings of clubs or organizations: Not on file    Relationship status: Not on file  . Intimate partner violence    Fear of current or ex partner: Not on file    Emotionally abused: Not on file    Physically abused: Not on file    Forced sexual activity: Not on file  Other Topics Concern  . Not on file  Social History Narrative   Lives at home with her husband and two children.   Left-handed.   No caffeine use.     PHYSICAL EXAM   Vitals:   09/26/19 1400  BP: (!) 153/90  Pulse: 99  Temp: (!) 97.1 F (36.2 C)  Weight: 236 lb 8 oz (107.3 kg)  Height: 5' (1.524 m)    Not recorded       ASSESSMENT AND PLAN  Chaquana Nichols is a 48 y.o. female   Chronic migraine Mood disorder Polypharmacy treatment   nortriptyline, 10 mg titrating  to 20 mg every night as preventive medication  Maxalt as needed  Her first Botox injection as migraine prevention, on Oct 05 2018.  Botox injection for chronic migraine prevention, injection was performed according to [place] protocol,  5 units of Botox was injected into each side, for 31 injection sites, total of 155 units  Bilateral frontalis 4 injection sites Bilateral corrugate 2 injection sites Procerus 1 injection sites. Bilateral temporalis 8 injection sites Bilateral occipitalis 6 injection sites Bilateral cervical paraspinals 4 injection sites Bilateral upper trapezius 6 injection sites  Extra 30 unites were injected into bilateral upper cervical regions.  15 units was injected into right masseters muscle.  Add on Aimovig 36m every 30 days as preventive medications   YMarcial Pacas M.D. Ph.D.  [facility] [address] [place] [address]Ph: [phone]: [phone] CC: KTeodora Medici PA-C

## 2019-12-26 ENCOUNTER — Telehealth: Payer: Self-pay

## 2019-12-26 MED ORDER — BOTOX 100 UNITS IJ SOLR: 200.0000 [IU] | INTRAMUSCULAR | 3 refills | Status: AC

## 2019-12-26 NOTE — Telephone Encounter (Signed)
Refills sent to requested pharmacy.

## 2019-12-26 NOTE — Telephone Encounter (Signed)
Please send an rx for Botox over to [facility] [place].

## 2020-01-02 ENCOUNTER — Ambulatory Visit: Payer: 59 | Admitting: Neurology

## 2020-01-08 ENCOUNTER — Telehealth: Payer: Self-pay | Admitting: Neurology

## 2020-01-08 NOTE — Telephone Encounter (Signed)
Pt called to reschedule botox apt

## 2020-02-04 ENCOUNTER — Telehealth: Payer: Self-pay | Admitting: *Deleted

## 2020-02-04 NOTE — Telephone Encounter (Signed)
I called [employer] [phone] and spoke to Isabella.  She states that [id number] and [phone] are billable and do not require PA but records need to be sent with the claim for the J Code.  Ref# for this call is [id number].  I called [facility] [phone] to schedule a medication delivery and spoke to [place].  She states they have everything they need but have reached out to patient for consent and have not gotten a response. Ref# for this call is [id number]  I called patient and left a message asking  her to call them at [phone] to give consent in order for [place] to get the medication in time for her visit.

## 2020-02-06 NOTE — Telephone Encounter (Signed)
I called [facility] [phone] to schedule medication delivery.  I spoke to [place] who states they still need to speak to the patient and have not heard from her.  They need her to contact [employer] to have them update her coordination of benefits.  Once this is done they can complete the process and we will be able to schedule the delivery.  I called patient to make her aware. She asked which insurance she is suppose to call because she has 2.  I told her it looked like her secondary insurance [place] is a [place] and they will not pay for her services her in [place]. She states she will call both insurances to verify.  I called [place] and confirmed [place] a [place].  Ref# for this call is [id number]/2020.

## 2020-02-07 NOTE — Telephone Encounter (Signed)
Patient rescheduled her Botox apt because her son is having surgery.

## 2020-02-13 ENCOUNTER — Ambulatory Visit: Payer: 59 | Admitting: Neurology

## 2021-01-13 ENCOUNTER — Other Ambulatory Visit: Payer: Self-pay | Admitting: Neurology

## 2021-12-28 ENCOUNTER — Ambulatory Visit: Payer: 59 | Admitting: Neurology
